# Patient Record
Sex: Male | Born: 1990 | Race: White | Hispanic: No | Marital: Single | Smoking: Current every day smoker
Health system: Southern US, Community
[De-identification: ages and names within clinical notes are randomized; demographics above are authoritative.]

## PROBLEM LIST (undated history)

## (undated) DIAGNOSIS — S42009A Fracture of unspecified part of unspecified clavicle, initial encounter for closed fracture: Secondary | ICD-10-CM

## (undated) DIAGNOSIS — G8929 Other chronic pain: Secondary | ICD-10-CM

## (undated) DIAGNOSIS — F988 Other specified behavioral and emotional disorders with onset usually occurring in childhood and adolescence: Secondary | ICD-10-CM

## (undated) DIAGNOSIS — M549 Dorsalgia, unspecified: Secondary | ICD-10-CM

## (undated) HISTORY — PX: EYE SURGERY: SHX253

---
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
## 2014-10-25 ENCOUNTER — Encounter: Payer: Self-pay | Admitting: Emergency Medicine

## 2014-10-25 ENCOUNTER — Emergency Department
Admission: EM | Admit: 2014-10-25 | Discharge: 2014-10-25 | Disposition: A | Discharge status: Home / Self Care | Payer: Self-pay | Attending: Emergency Medicine | Admitting: Emergency Medicine

## 2014-10-25 ENCOUNTER — Emergency Department: Payer: Self-pay

## 2014-10-25 DIAGNOSIS — X58XXXA Exposure to other specified factors, initial encounter: Secondary | ICD-10-CM | POA: Insufficient documentation

## 2014-10-25 DIAGNOSIS — S8992XA Unspecified injury of left lower leg, initial encounter: Secondary | ICD-10-CM | POA: Insufficient documentation

## 2014-10-25 DIAGNOSIS — Y9389 Activity, other specified: Secondary | ICD-10-CM | POA: Insufficient documentation

## 2014-10-25 DIAGNOSIS — Z720 Tobacco use: Secondary | ICD-10-CM | POA: Insufficient documentation

## 2014-10-25 DIAGNOSIS — Y9289 Other specified places as the place of occurrence of the external cause: Secondary | ICD-10-CM | POA: Insufficient documentation

## 2014-10-25 DIAGNOSIS — Y998 Other external cause status: Secondary | ICD-10-CM | POA: Insufficient documentation

## 2014-10-25 DIAGNOSIS — S39012A Strain of muscle, fascia and tendon of lower back, initial encounter: Secondary | ICD-10-CM | POA: Insufficient documentation

## 2014-10-25 HISTORY — DX: Dorsalgia, unspecified: M54.9

## 2014-10-25 HISTORY — DX: Other chronic pain: G89.29

## 2014-10-25 HISTORY — DX: Fracture of unspecified part of unspecified clavicle, initial encounter for closed fracture: S42.009A

## 2014-10-25 HISTORY — DX: Other specified behavioral and emotional disorders with onset usually occurring in childhood and adolescence: F98.8

## 2014-10-25 MED ORDER — OXYCODONE-ACETAMINOPHEN 5-325 MG PO TABS
ORAL_TABLET | ORAL | Status: AC
  Administered 2014-10-25: 1 via ORAL
  Filled 2014-10-25: qty 1

## 2014-10-25 MED ORDER — IBUPROFEN 800 MG PO TABS
ORAL_TABLET | ORAL | Status: AC
  Administered 2014-10-25: 800 mg via ORAL
  Filled 2014-10-25: qty 1

## 2014-10-25 MED ORDER — OXYCODONE-ACETAMINOPHEN 5-325 MG PO TABS: 1.0000 | ORAL_TABLET | Freq: Four times a day (QID) | ORAL | Status: DC | PRN

## 2014-10-25 MED ORDER — IBUPROFEN 800 MG PO TABS
800.0000 mg | ORAL_TABLET | ORAL | Status: AC
  Administered 2014-10-25: 800 mg via ORAL

## 2014-10-25 MED ORDER — OXYCODONE-ACETAMINOPHEN 5-325 MG PO TABS
1.0000 | ORAL_TABLET | ORAL | Status: AC
  Administered 2014-10-25: 1 via ORAL

## 2014-10-25 MED ORDER — OXYCODONE-ACETAMINOPHEN 5-325 MG PO TABS: 10.0000 | ORAL_TABLET | ORAL | Status: DC
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
## 2014-10-25 NOTE — ED Notes (Signed)
Patient to ER for c/o numbness to BLE. Patient states he picked up tool box from front seat of car that weighed approx 150lbs, immediately had back pain. Patient states gradually developed numbness and pain to legs. States when he got home and laid down, numbness got worse. States numbness lasted approx 1 hour, then had "shock" feeling up legs with "knife stabbing" pain in lower back. Patient continues to have back pain.

## 2014-10-25 NOTE — Discharge Instructions (Signed)
Back Pain, Adult  Please follow up with Dr. Quillian QuinceBliss and Dr. Rosita KeaMenz of orthopedics early this week.  Do not drive today or while taking Percocet. Do not take any other medications which are not prescribed or over-the-counter in conjunction with Percocet.  Low back pain is very common. About 1 in 5 people have back pain.The cause of low back pain is rarely dangerous. The pain often gets better over time.About half of people with a sudden onset of back pain feel better in just 2 weeks. About 8 in 10 people feel better by 6 weeks.  CAUSES Some common causes of back pain include:  Strain of the muscles or ligaments supporting the spine.  Wear and tear (degeneration) of the spinal discs.  Arthritis.  Direct injury to the back. DIAGNOSIS Most of the time, the direct cause of low back pain is not known.However, back pain can be treated effectively even when the exact cause of the pain is unknown.Answering your caregiver's questions about your overall health and symptoms is one of the most accurate ways to make sure the cause of your pain is not dangerous. If your caregiver needs more information, he or she may order lab work or imaging tests (X-rays or MRIs).However, even if imaging tests show changes in your back, this usually does not require surgery. HOME CARE INSTRUCTIONS For many people, back pain returns.Since low back pain is rarely dangerous, it is often a condition that people can learn to [facility]manageon their own.   Remain active. It is stressful on the back to sit or stand in one place. Do not sit, drive, or stand in one place for more than 30 minutes at a time. Take short walks on level surfaces as soon as pain allows.Try to increase the length of time you walk each day.  Do not stay in bed.Resting more than 1 or 2 days can delay your recovery.  Do not avoid exercise or work.Your body is made to move.It is not dangerous to be active, even though your back may hurt.Your back will  likely heal faster if you return to being active before your pain is gone.  Pay attention to your body when you bend and lift. Many people have less discomfortwhen lifting if they bend their knees, keep the load close to their bodies,and avoid twisting. Often, the most comfortable positions are those that put less stress on your recovering back.  Find a comfortable position to sleep. Use a firm mattress and lie on your side with your knees slightly bent. If you lie on your back, put a pillow under your knees.  Only take over-the-counter or prescription medicines as directed by your caregiver. Over-the-counter medicines to reduce pain and inflammation are often the most helpful.Your caregiver may prescribe muscle relaxant drugs.These medicines help dull your pain so you can more quickly return to your normal activities and healthy exercise.  Put ice on the injured area.  Put ice in a plastic bag.  Place a towel between your skin and the bag.  Leave the ice on for 15-20 minutes, 03-04 times a day for the first 2 to 3 days. After that, ice and heat may be alternated to reduce pain and spasms.  Ask your caregiver about trying back exercises and gentle massage. This may be of some benefit.  Avoid feeling anxious or stressed.Stress increases muscle tension and can worsen back pain.It is important to recognize when you are anxious or stressed and learn ways to manage it.Exercise is a great  option. SEEK MEDICAL CARE IF:  You have pain that is not relieved with rest or medicine.  You have pain that does not improve in 1 week.  You have new symptoms.  You are generally not feeling well. SEEK IMMEDIATE MEDICAL CARE IF:   You have pain that radiates from your back into your legs.  You develop new bowel or bladder control problems.  You have unusual weakness or numbness in your arms or legs.  You develop nausea or vomiting.  You develop abdominal pain.  You feel faint. Document  Released: 06/05/2005 Document Revised: 12/05/2011 Document Reviewed: 10/07/2013 [facility] Patient Information 2015 [place], [place]. This information is not intended to replace advice given to you by your health care provider. Make sure you discuss any questions you have with your health care provider.

## 2017-02-23 ENCOUNTER — Encounter: Payer: Self-pay | Admitting: Emergency Medicine

## 2017-02-23 ENCOUNTER — Emergency Department: Payer: Self-pay

## 2017-02-23 ENCOUNTER — Emergency Department
Admission: EM | Admit: 2017-02-23 | Discharge: 2017-02-23 | Disposition: A | Discharge status: Home / Self Care | Payer: Self-pay | Attending: Emergency Medicine | Admitting: Emergency Medicine

## 2017-02-23 DIAGNOSIS — G8929 Other chronic pain: Secondary | ICD-10-CM | POA: Insufficient documentation

## 2017-02-23 DIAGNOSIS — M5412 Radiculopathy, cervical region: Secondary | ICD-10-CM

## 2017-02-23 DIAGNOSIS — M25512 Pain in left shoulder: Secondary | ICD-10-CM

## 2017-02-23 DIAGNOSIS — F17200 Nicotine dependence, unspecified, uncomplicated: Secondary | ICD-10-CM | POA: Insufficient documentation

## 2017-02-23 DIAGNOSIS — M25519 Pain in unspecified shoulder: Secondary | ICD-10-CM

## 2017-02-23 MED ORDER — PREDNISONE 20 MG PO TABS
60.0000 mg | ORAL_TABLET | Freq: Once | ORAL | Status: AC
  Administered 2017-02-23: 60 mg via ORAL
  Filled 2017-02-23: qty 3

## 2017-02-23 MED ORDER — PREDNISONE 10 MG PO TABS
ORAL_TABLET | ORAL | 0 refills | Status: DC
Start: 2017-02-23 — End: 2023-04-09

## 2017-02-23 MED ORDER — KETOROLAC TROMETHAMINE 30 MG/ML IJ SOLN
30.0000 mg | Freq: Once | INTRAMUSCULAR | Status: AC
  Administered 2017-02-23: 30 mg via INTRAMUSCULAR
  Filled 2017-02-23: qty 1
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
## 2017-02-23 NOTE — ED Triage Notes (Signed)
Pt reports recurrent left shoulder pain that has been gradually increasing for three weeks. Pt denies recent injury but states he injured the shoulder 4 years ago. No obvious deformity noted. Pt reports pain increases with movement and lifting his left arm. Pt ambulatory to triage. No apparent distress noted.

## 2017-02-23 NOTE — Discharge Instructions (Signed)
Please take medications as prescribed. Follow-up with orthopedics in one week if no improvement. Return to the ER for any increasing pain worsening symptoms or changes in her health.

## 2018-04-17 IMAGING — CR DG SHOULDER 2+V*L*
1 series · 3 of 3 positions shown · non-contrast
Comparison: Clavicle radiographs 04/13/2011

CLINICAL DATA: Left shoulder pain.

EXAM:
LEFT SHOULDER - 2+ VIEW

[Series 1: dg shoulder left · 0.14mm/px · 3 of 3 slices shown]
[im 1/3]
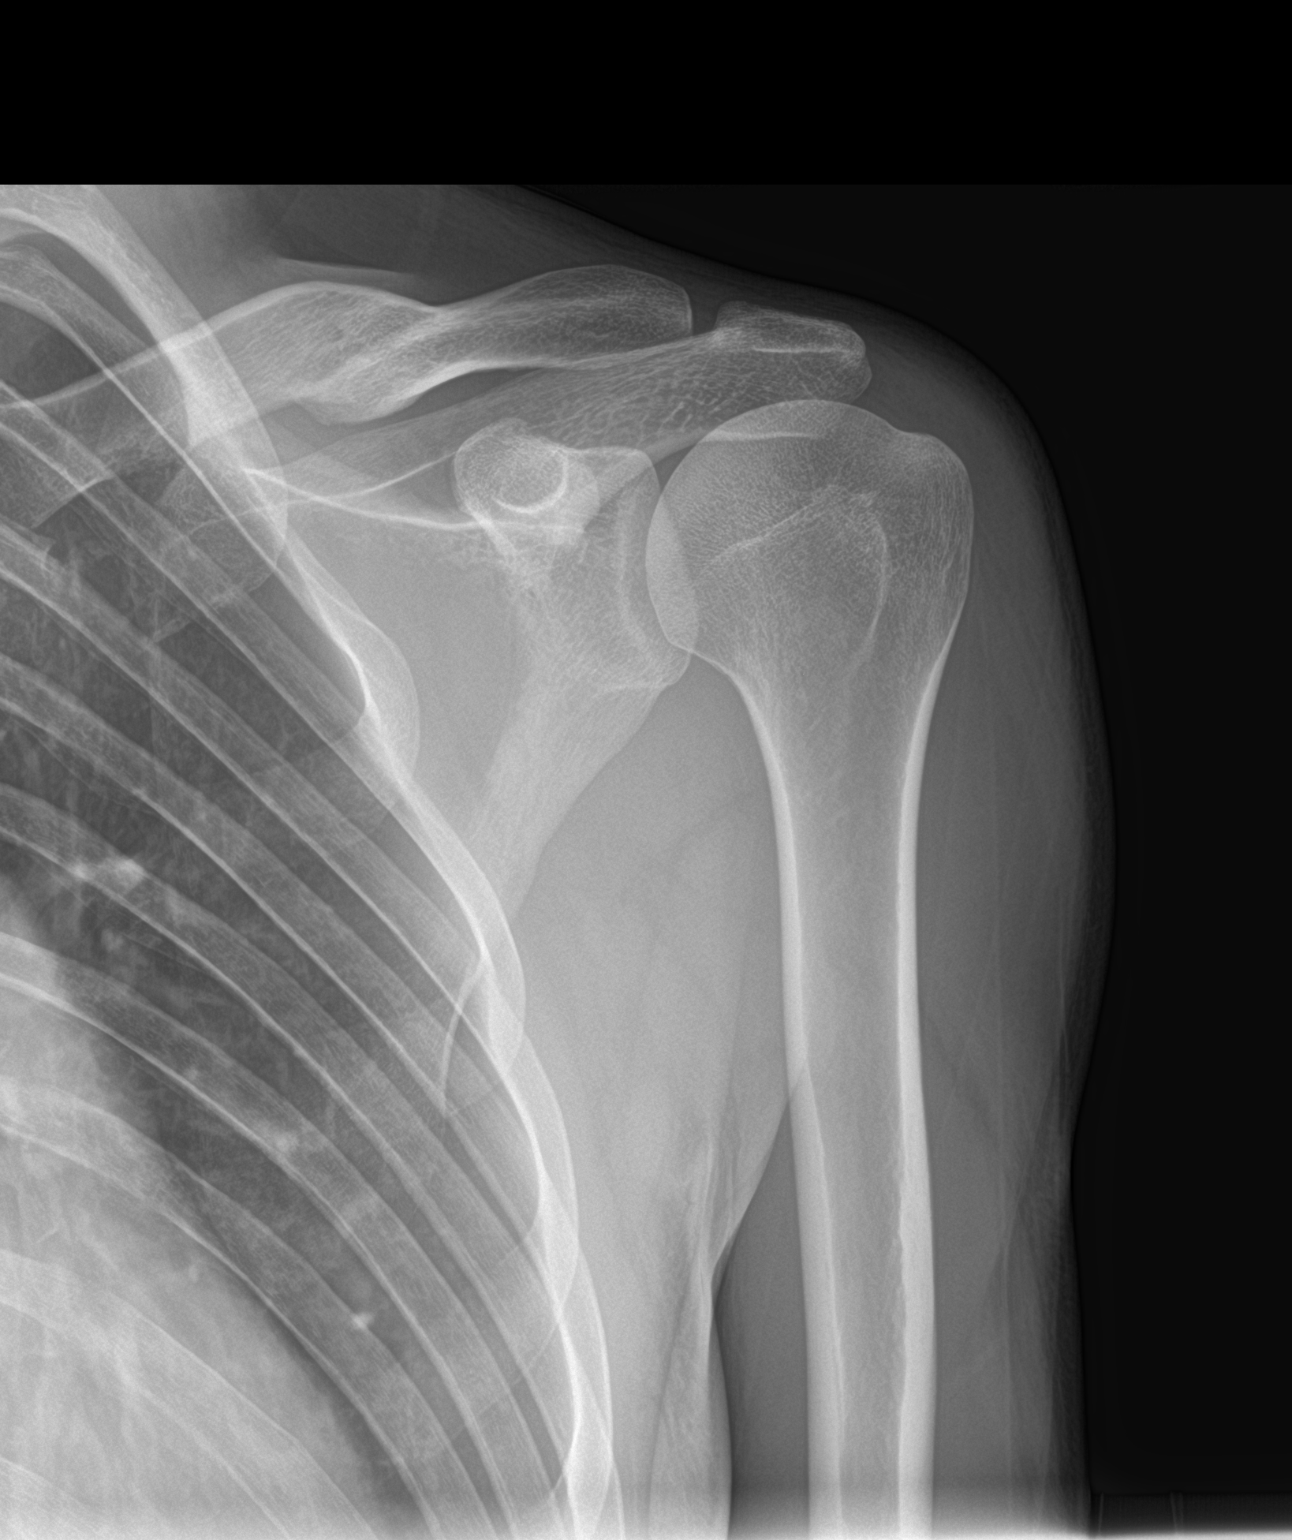
[im 2/3]
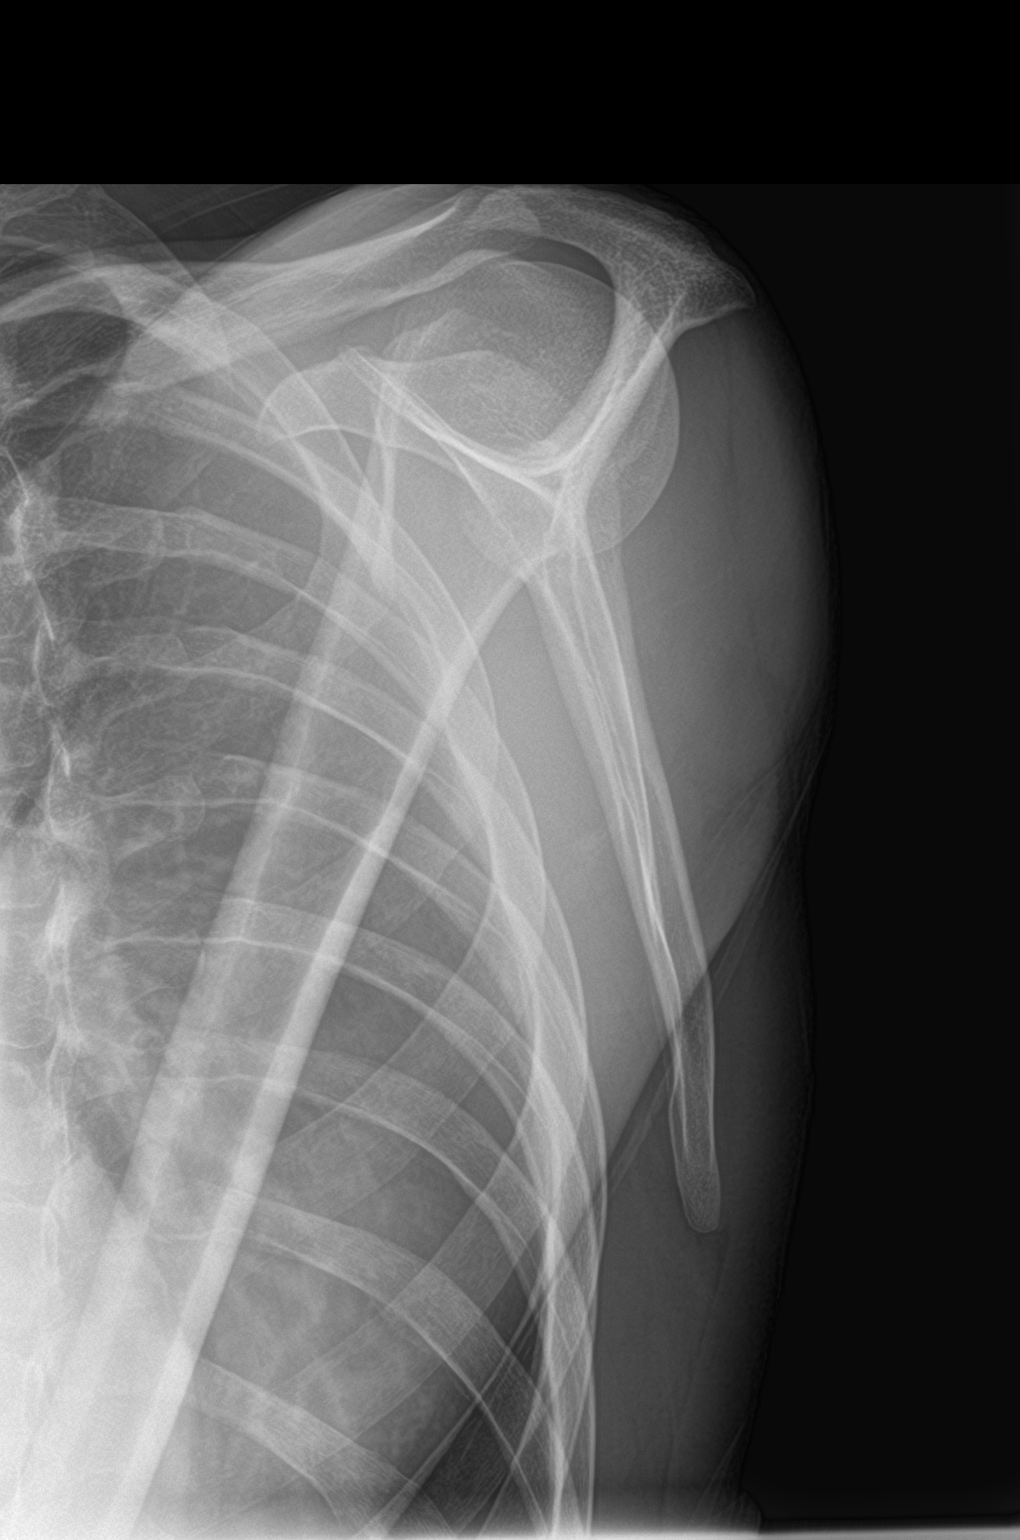
[im 3/3]
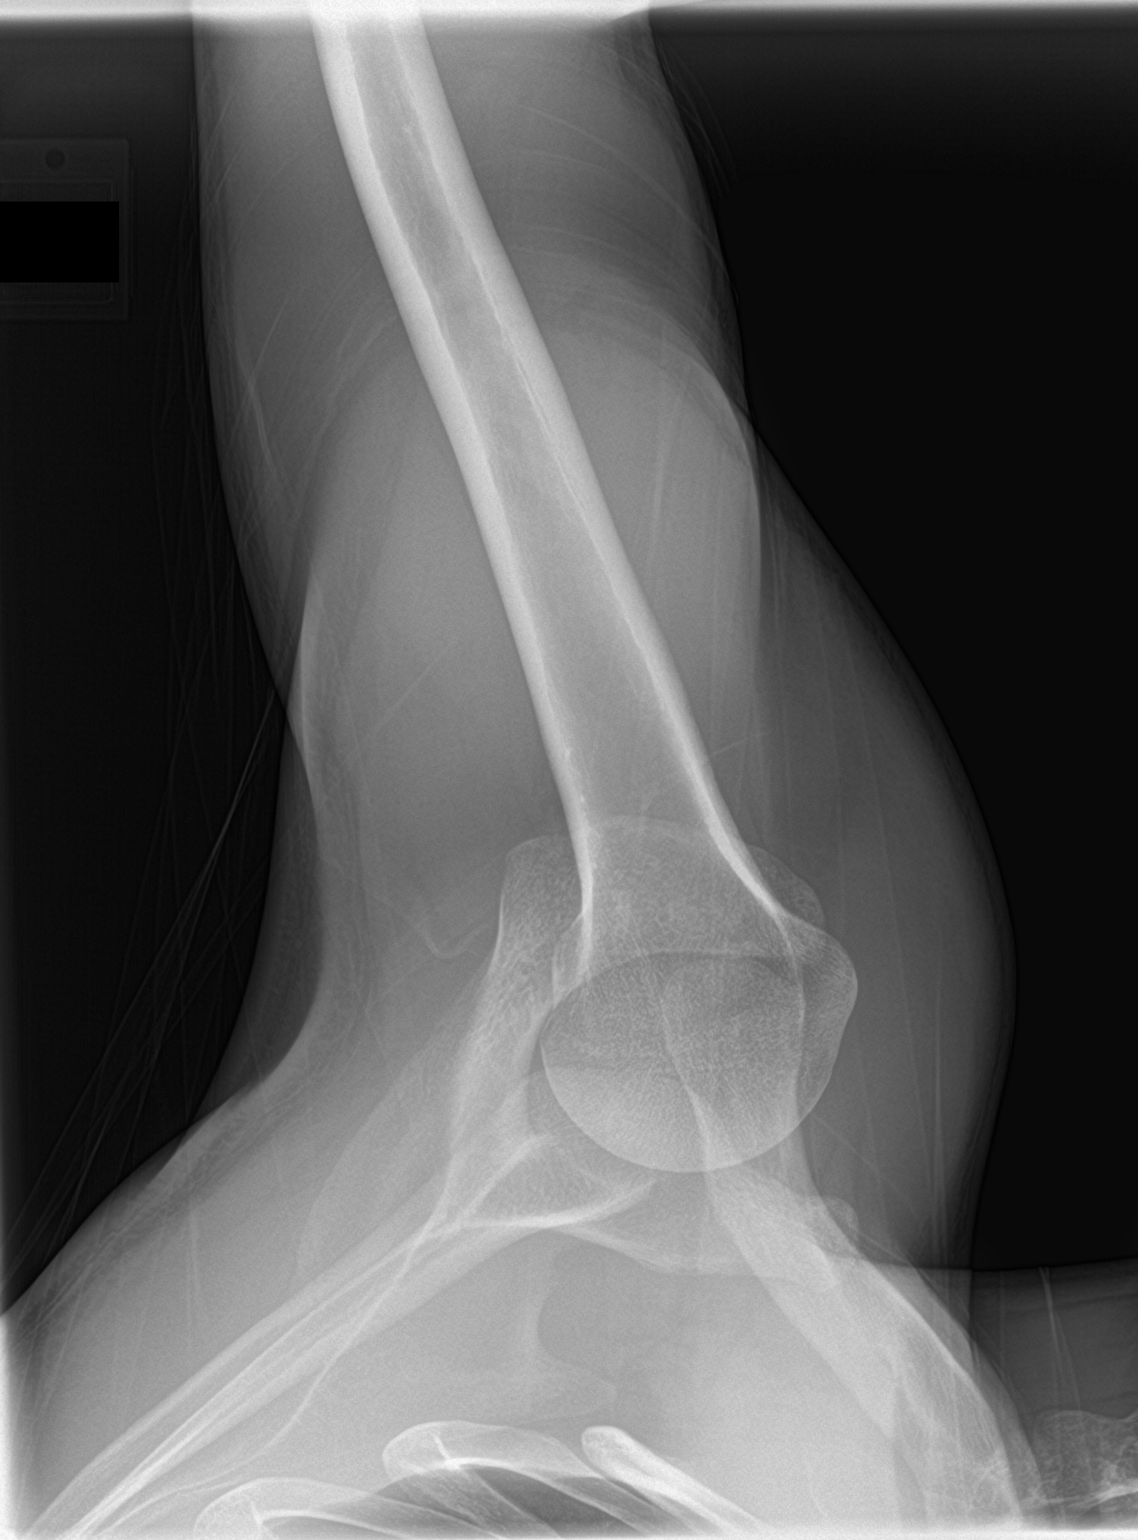

[3 of 3 positions shown; findings below may reference images not displayed]

FINDINGS: Remote midshaft left clavicle fracture with small osseous
remottling, no residual fracture lucency. Normal glenohumeral and
acromioclavicular alignment. No acute fracture. Normal soft tissues.
IMPRESSION: No acute abnormality.  Remote healed left clavicle fracture.

## 2019-09-15 ENCOUNTER — Ambulatory Visit: Payer: Self-pay | Attending: Internal Medicine

## 2019-09-15 DIAGNOSIS — Z23 Encounter for immunization: Secondary | ICD-10-CM

## 2019-09-15 NOTE — Progress Notes (Signed)
   Covid-19 Vaccination Clinic  Name:  ROSS BENDER    MRN: [id number] DOB: May 03, 1991  09/15/2019  Mr. Cephas was observed post Covid-19 immunization for 15 minutes without incident. He was provided with Vaccine Information Sheet and instruction to access the V-Safe system.   Mr. Criado was instructed to call 911 with any severe reactions post vaccine: Marland Kitchen Difficulty breathing  . Swelling of face and throat  . A fast heartbeat  . A bad rash all over body  . Dizziness and weakness   Immunizations Administered    Name Date Dose VIS Date Route   Pfizer COVID-19 Vaccine 09/15/2019  3:33 PM 0.3 mL 05/30/2019 Intramuscular   Manufacturer: [employer], [employer]   Lot: [id number]   NDC: [address]-9

## 2019-10-06 ENCOUNTER — Ambulatory Visit: Payer: Self-pay | Attending: Internal Medicine

## 2019-10-06 DIAGNOSIS — Z23 Encounter for immunization: Secondary | ICD-10-CM

## 2019-10-06 NOTE — Progress Notes (Signed)
   Covid-19 Vaccination Clinic  Name:  EMERALD GEHRES    MRN: [id number] DOB: 16-Mar-1991  10/06/2019  Mr. Corella was observed post Covid-19 immunization for 15 minutes without incident. He was provided with Vaccine Information Sheet and instruction to access the V-Safe system.   Mr. Rosier was instructed to call 911 with any severe reactions post vaccine: Marland Kitchen Difficulty breathing  . Swelling of face and throat  . A fast heartbeat  . A bad rash all over body  . Dizziness and weakness   Immunizations Administered    Name Date Dose VIS Date Route   Pfizer COVID-19 Vaccine 10/06/2019  3:09 PM 0.3 mL 08/13/2018 Intramuscular   Manufacturer: [employer], [employer]   Lot: [id number]   NDC: [address]-5

## 2022-12-29 ENCOUNTER — Other Ambulatory Visit: Payer: Self-pay

## 2022-12-29 ENCOUNTER — Emergency Department
Admission: EM | Admit: 2022-12-29 | Discharge: 2022-12-29 | Disposition: A | Discharge status: Home / Self Care | Payer: Self-pay | Attending: Emergency Medicine | Admitting: Emergency Medicine

## 2022-12-29 ENCOUNTER — Emergency Department: Payer: Self-pay

## 2022-12-29 DIAGNOSIS — Y92410 Unspecified street and highway as the place of occurrence of the external cause: Secondary | ICD-10-CM | POA: Insufficient documentation

## 2022-12-29 DIAGNOSIS — S9031XA Contusion of right foot, initial encounter: Secondary | ICD-10-CM | POA: Insufficient documentation

## 2022-12-29 MED ORDER — KETOROLAC TROMETHAMINE 15 MG/ML IJ SOLN
15.0000 mg | Freq: Once | INTRAMUSCULAR | Status: AC
  Administered 2022-12-29: 15 mg via INTRAMUSCULAR
  Filled 2022-12-29: qty 1

## 2022-12-29 MED ORDER — MELOXICAM 15 MG PO TABS: 15.0000 mg | ORAL_TABLET | Freq: Every day | ORAL | 1 refills | Status: AC

## 2022-12-29 NOTE — ED Provider Notes (Signed)
[facility] Provider Note  Patient Contact: 3:35 PM (approximate)   History   Foot Injury (Right Foot (yesterday))   HPI  Todd Espinoza is a 32 y.o. male presents to the emergency department with acute right foot pain after patient states that a small, 4000 pound tractor ran over his right foot.  Patient reports that injury occurred yesterday and he sustained no abrasions or lacerations.  He has been able to bear weight easily since injury occurred but he states that he does have pain.  He has had no prior surgeries of the right foot in the past and has had no numbness or tingling.      Physical Exam   Triage Vital Signs: ED Triage Vitals [12/29/22 1518]  Encounter Vitals Group     BP (!) 154/99     Systolic BP Percentile      Diastolic BP Percentile      Pulse Rate 99     Resp 16     Temp 97.9 F (36.6 C)     Temp Source Oral     SpO2 98 %     Weight 200 lb (90.7 kg)     Height 6' (1.829 m)     Head Circumference      Peak Flow      Pain Score 5     Pain Loc      Pain Education      Exclude from Growth Chart     Most recent vital signs: Vitals:   12/29/22 1518  BP: (!) 154/99  Pulse: 99  Resp: 16  Temp: 97.9 F (36.6 C)  SpO2: 98%     General: Alert and in no acute distress. Eyes:  PERRL. EOMI. Head: No acute traumatic findings ENT:      Nose: No congestion/rhinnorhea.      Mouth/Throat: Mucous membranes are moist.  Neck: No stridor. No cervical spine tenderness to palpation. Cardiovascular:  Good peripheral perfusion Respiratory: Normal respiratory effort without tachypnea or retractions. Lungs CTAB. Good air entry to the bases with no decreased or absent breath sounds. Gastrointestinal: Bowel sounds 4 quadrants. Soft and nontender to palpation. No guarding or rigidity. No palpable masses. No distention. No CVA tenderness. Musculoskeletal: Full range of motion to all extremities.  No edema or ecchymosis along the posterior  aspect of the right foot.  Palpable dorsalis pedis pulse bilaterally and symmetrically.  Capillary refill less than 2 seconds on the right. Neurologic:  No gross focal neurologic deficits are appreciated.  Skin:   No rash noted Other:   ED Results / Procedures / Treatments   Labs (all labs ordered are listed, but only abnormal results are displayed) Labs Reviewed - No data to display     RADIOLOGY  I personally viewed and evaluated these images as part of my medical decision making, as well as reviewing the written report by the radiologist.  ED Provider Interpretation: X-ray of the the right foot unremarkable.   PROCEDURES:  Critical Care performed: No  Procedures   MEDICATIONS ORDERED IN ED: Medications  ketorolac (TORADOL) 15 MG/ML injection 15 mg (has no administration in time range)     IMPRESSION / MDM / ASSESSMENT AND PLAN / ED COURSE  I reviewed the triage vital signs and the nursing notes.                              Assessment and plan  Foot pain 32 year old male presents to the emergency department with acute right foot pain after patient reportedly had a small tractor ran over his foot yesterday.  Patient was hypertensive at triage but vital signs otherwise reassuring.  On exam, patient was alert, active and nontoxic-appearing.  Foot was nonedematous with no overlying ecchymosis, abrasions or lacerations.  Will obtain x-ray and will reassess.  X-ray unremarkable.  Will place patient in a cam boot and given injection of Toradol for pain.  Patient was discharged with daily meloxicam.     FINAL CLINICAL IMPRESSION(S) / ED DIAGNOSES   Final diagnoses:  Contusion of right foot, initial encounter     Rx / DC Orders   ED Discharge Orders          Ordered    meloxicam (MOBIC) 15 MG tablet  Daily        12/29/22 1624             Note:  This document was prepared using Dragon voice recognition software and may include unintentional dictation  errors.   Pia Mau [place], PA-C 12/29/22 1627    Dionne Bucy, MD 12/29/22 1714

## 2022-12-29 NOTE — Discharge Instructions (Addendum)
Take Meloxicam once daily for pain and inflammation.  

## 2022-12-29 NOTE — ED Notes (Signed)
Back from radiology, EDP at [facility]

## 2022-12-29 NOTE — ED Triage Notes (Signed)
Pt to ed from home via POV for a foot injury that happened yesterday at 3pm./ He states "I tractor ran over my foot". Pt was able to continue working and didn't feel the need to come until today. Pt has foot bandaged and doesn't appear to be swollen and he is able to bear weight on it. Pt I caox4, in no acute distress and wheel chair in triage.

## 2023-04-09 ENCOUNTER — Encounter: Payer: Self-pay | Admitting: Emergency Medicine

## 2023-04-09 ENCOUNTER — Emergency Department: Payer: Self-pay

## 2023-04-09 ENCOUNTER — Other Ambulatory Visit: Payer: Self-pay

## 2023-04-09 ENCOUNTER — Emergency Department
Admission: EM | Admit: 2023-04-09 | Discharge: 2023-04-09 | Disposition: A | Discharge status: Home / Self Care | Payer: Self-pay | Attending: Emergency Medicine | Admitting: Emergency Medicine

## 2023-04-09 DIAGNOSIS — M25512 Pain in left shoulder: Secondary | ICD-10-CM | POA: Insufficient documentation

## 2023-04-09 MED ORDER — NAPROXEN 500 MG PO TABS: 500.0000 mg | ORAL_TABLET | Freq: Two times a day (BID) | ORAL | 0 refills | Status: AC

## 2023-04-09 MED ORDER — KETOROLAC TROMETHAMINE 15 MG/ML IJ SOLN
15.0000 mg | Freq: Once | INTRAMUSCULAR | Status: AC
  Administered 2023-04-09: 15 mg via INTRAMUSCULAR
  Filled 2023-04-09: qty 1

## 2023-04-09 NOTE — ED Provider Triage Note (Signed)
Emergency Medicine Provider Triage Evaluation Note  Todd Espinoza , a 32 y.o. male  was evaluated in triage.  Pt complains of left shoulder pain, chronic but worsening, thinks he might have a tear, difficult to raise arm overhead, etc.  Review of Systems  Positive:  Negative:   Physical Exam  BP (!) 149/103 (BP Location: Left Arm)   Pulse 92   Temp 98.6 F (37 C) (Oral)   Resp 18   Ht 6' (1.829 m)   Wt 85.3 kg   SpO2 100%   BMI 25.50 kg/m  Gen:   Awake, no distress   Resp:  Normal effort  MSK:   Decreased rom of left shoulder  Other:    Medical Decision Making  Medically screening exam initiated at 11:53 AM.  Appropriate orders placed.  Todd Espinoza was informed that the remainder of the evaluation will be completed by another provider, this initial triage assessment does not replace that evaluation, and the importance of remaining in the ED until their evaluation is complete.     Faythe Ghee, PA-C 04/09/23 1154

## 2023-04-09 NOTE — ED Triage Notes (Signed)
Pt ambulatory to emergency room for increasing left shoulder pain for the last week. Contacted PCP who thought it might be a torn rotator cuff.

## 2023-04-09 NOTE — ED Provider Notes (Signed)
[facility] Provider Note    Event Date/Time   First MD Initiated Contact with Patient 04/09/23 1236     (approximate)   History   Shoulder Pain   HPI  Todd Espinoza is a 32 y.o. male left-hand-dominant presents today for evaluation of left shoulder pain.  Patient reports that he had a previous injury multiple years ago, and had been doing well into the past 4 days.  He reports that he has pain with any sort of forward flexion or abduction of his shoulder and he cannot go past 90 degrees.  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  He denies any neck pain or radicular symptoms in his arm.  No previous surgery on his shoulder.  He called his PCP who advised him to come to the emergency department for evaluation.  There are no problems to display for this patient.         Physical Exam   Triage Vital Signs: ED Triage Vitals  Encounter Vitals Group     BP 04/09/23 1144 (!) 149/103     Systolic BP Percentile --      Diastolic BP Percentile --      Pulse Rate 04/09/23 1144 92     Resp 04/09/23 1144 18     Temp 04/09/23 1144 98.6 F (37 C)     Temp Source 04/09/23 1144 Oral     SpO2 04/09/23 1144 100 %     Weight 04/09/23 1145 188 lb (85.3 kg)     Height 04/09/23 1145 6' (1.829 m)     Head Circumference --      Peak Flow --      Pain Score 04/09/23 1152 8     Pain Loc --      Pain Education --      Exclude from Growth Chart --     Most recent vital signs: Vitals:   04/09/23 1144  BP: (!) 149/103  Pulse: 92  Resp: 18  Temp: 98.6 F (37 C)  SpO2: 100%    Physical Exam Vitals and nursing note reviewed.  Constitutional:      General: Awake and alert. No acute distress.    Appearance: Normal appearance. The patient is normal weight.  HENT:     Head: Normocephalic and atraumatic.     Mouth: Mucous membranes are moist.  Eyes:     General: PERRL. Normal  EOMs        Right eye: No discharge.        Left eye: No discharge.     Conjunctiva/sclera: Conjunctivae normal.  Cardiovascular:     Rate and Rhythm: Normal rate and regular rhythm.     Pulses: Normal pulses.     Heart sounds: Normal heart sounds Pulmonary:     Effort: Pulmonary effort is normal. No respiratory distress.     Breath sounds: Normal breath sounds.  Abdominal:     Abdomen is soft. There is no abdominal tenderness. No rebound or guarding. No distention. Musculoskeletal:        General: No swelling. Normal range of motion.     Cervical back: Normal range of motion and neck supple. No midline cervical spine tenderness.  Full range of motion of neck.  Negative Spurling test.  Negative Lhermitte sign.  Normal strength and sensation in bilateral upper extremities. Normal grip strength bilaterally.  Normal  intrinsic muscle function of the hand bilaterally.  Normal radial pulses bilaterally. Left shoulder: No obvious deformity, swelling, ecchymosis, or erythema No clavicular or AC joint tenderness Able to actively and passively forward flex and abduct at shoulder to 90 degrees only, negative drop arm test Pain with Obriens, SLAP, empty can, and lift off tests Pain with internal and external rotation against resistance Pain with Hawkins and Neers Normal ROM at elbow and wrist Normal resisted pronation and supination 2+ radial pulse Normal grip strength Normal intrinsic hand muscle function Skin:    General: Skin is warm and dry.     Capillary Refill: Capillary refill takes less than 2 seconds.     Findings: No rash.  Neurological:     Mental Status: The patient is awake and alert.      ED Results / Procedures / Treatments   Labs (all labs ordered are listed, but only abnormal results are displayed) Labs Reviewed - No data to display   EKG     RADIOLOGY I independently reviewed and interpreted imaging and agree with radiologists  findings.     PROCEDURES:  Critical Care performed:   Procedures   MEDICATIONS ORDERED IN ED: Medications  ketorolac (TORADOL) 15 MG/ML injection 15 mg (15 mg Intramuscular Given 04/09/23 1319)     IMPRESSION / MDM / ASSESSMENT AND PLAN / ED COURSE  I reviewed the triage vital signs and the nursing notes.   Differential diagnosis includes, but is not limited to, rotator cuff injury, bursitis, labral injury, less likely fracture or dislocation.  Patient is awake and alert, hemodynamically stable and afebrile.  There is no cervical spine tenderness, negative Lhermitte and Spurling signs, do not suspect cervical radiculopathy or cervical etiology.  He has normal grip strength, normal intrinsic muscle function of his hands bilaterally, normal radial pulse.  He has significant pain with forward flexion and abduction I cannot go past 90 degrees secondary to pain.  He also has significant pain with O'Brien's and empty can and SLAP test.  I suspect rotator cuff etiology.  X-ray obtained and per my independent interpretation reveals no acute osseous injury.  We discussed symptomatic management and the importance of close outpatient follow-up with orthopedics for further management including possible MRI.  He was encouraged to call today to make an appointment.  He was given the appropriate follow-up information.  We discussed the option of sling for comfort though however I advised him of the risk of adhesive capsulitis and he prefers not to have a sling.  He requested a work note which was provided.  We discussed return precautions and the importance of close outpatient follow-up.  Patient understands and agrees with plan.  He was discharged in stable condition.  Patient's presentation is most consistent with acute complicated illness / injury requiring diagnostic workup.    FINAL CLINICAL IMPRESSION(S) / ED DIAGNOSES   Final diagnoses:  Acute pain of left shoulder     Rx / DC Orders    ED Discharge Orders          Ordered    naproxen (NAPROSYN) 500 MG tablet  2 times daily with meals        04/09/23 1316             Note:  This document was prepared using Dragon voice recognition software and may include unintentional dictation errors.   Jackelyn Hoehn, PA-C 04/09/23 1707    Pilar Jarvis, MD 04/10/23 [phone]

## 2023-04-09 NOTE — Discharge Instructions (Addendum)
Please follow-up with orthopedics for your shoulder pain.  Please return the emergency department for any new, worsening, or change in symptoms or other concerns.  It was a pleasure caring for you today.

## 2023-12-01 ENCOUNTER — Encounter: Payer: Self-pay | Admitting: Emergency Medicine

## 2023-12-01 ENCOUNTER — Emergency Department
Admission: EM | Admit: 2023-12-01 | Discharge: 2023-12-02 | Disposition: A | Discharge status: Other Acute Inpatient Hospital | Payer: Self-pay | Attending: Emergency Medicine | Admitting: Emergency Medicine

## 2023-12-01 ENCOUNTER — Other Ambulatory Visit: Payer: Self-pay

## 2023-12-01 DIAGNOSIS — F4321 Adjustment disorder with depressed mood: Secondary | ICD-10-CM

## 2023-12-01 DIAGNOSIS — Y906 Blood alcohol level of 120-199 mg/100 ml: Secondary | ICD-10-CM | POA: Insufficient documentation

## 2023-12-01 DIAGNOSIS — F1994 Other psychoactive substance use, unspecified with psychoactive substance-induced mood disorder: Secondary | ICD-10-CM

## 2023-12-01 DIAGNOSIS — F10129 Alcohol abuse with intoxication, unspecified: Secondary | ICD-10-CM | POA: Insufficient documentation

## 2023-12-01 DIAGNOSIS — F10920 Alcohol use, unspecified with intoxication, uncomplicated: Secondary | ICD-10-CM

## 2023-12-01 DIAGNOSIS — F1290 Cannabis use, unspecified, uncomplicated: Secondary | ICD-10-CM | POA: Insufficient documentation

## 2023-12-01 DIAGNOSIS — R45851 Suicidal ideations: Secondary | ICD-10-CM | POA: Insufficient documentation

## 2023-12-01 LAB — COMPREHENSIVE METABOLIC PANEL WITH GFR
ALT: 26 U/L (ref 0–44)
AST: 23 U/L (ref 15–41)
Albumin: 4.4 g/dL (ref 3.5–5.0)
Alkaline Phosphatase: 67 U/L (ref 38–126)
Anion gap: 12 (ref 5–15)
BUN: 10 mg/dL (ref 6–20)
CO2: 21 mmol/L — ABNORMAL LOW (ref 22–32)
Calcium: 9.5 mg/dL (ref 8.9–10.3)
Chloride: 109 mmol/L (ref 98–111)
Creatinine, Ser: 0.81 mg/dL (ref 0.61–1.24)
GFR, Estimated: >60 mL/min (ref >60)
Glucose, Bld: 105 mg/dL — ABNORMAL HIGH (ref 70–99)
Potassium: 4 mmol/L (ref 3.5–5.1)
Sodium: 142 mmol/L (ref 135–145)
Total Bilirubin: 0.5 mg/dL (ref 0.0–1.2)
Total Protein: 7.9 g/dL (ref 6.5–8.1)

## 2023-12-01 LAB — CBC
HCT: 43.7 % (ref 39.0–52.0)
Hemoglobin: 14.7 g/dL (ref 13.0–17.0)
MCH: 30.9 pg (ref 26.0–34.0)
MCHC: 33.6 g/dL (ref 30.0–36.0)
MCV: 91.8 fL (ref 80.0–100.0)
Platelets: 235 10*3/uL (ref 150–400)
RBC: 4.76 MIL/uL (ref 4.22–5.81)
RDW: 12 % (ref 11.5–15.5)
WBC: 10.9 10*3/uL — ABNORMAL HIGH (ref 4.0–10.5)
nRBC: 0 % (ref 0.0–0.2)

## 2023-12-01 LAB — URINE DRUG SCREEN, QUALITATIVE (ARMC ONLY)
Amphetamines, Ur Screen: NOT DETECTED
Barbiturates, Ur Screen: NOT DETECTED
Benzodiazepine, Ur Scrn: POSITIVE — AB
Cannabinoid 50 Ng, Ur ~~LOC~~: POSITIVE — AB
Cocaine Metabolite,Ur ~~LOC~~: NOT DETECTED
MDMA (Ecstasy)Ur Screen: NOT DETECTED
Methadone Scn, Ur: NOT DETECTED
Opiate, Ur Screen: NOT DETECTED
Phencyclidine (PCP) Ur S: NOT DETECTED
Tricyclic, Ur Screen: POSITIVE — AB

## 2023-12-01 LAB — ETHANOL: Alcohol, Ethyl (B): 129 mg/dL — ABNORMAL HIGH (ref <15)

## 2023-12-01 MED ORDER — HYDROXYZINE HCL 25 MG PO TABS: 25.0000 mg | ORAL_TABLET | Freq: Three times a day (TID) | ORAL | Status: DC | PRN

## 2023-12-01 MED ORDER — FLUOXETINE HCL 10 MG PO CAPS
10.0000 mg | ORAL_CAPSULE | Freq: Every day | ORAL | Status: DC
  Administered 2023-12-02: 10 mg via ORAL
  Filled 2023-12-01 (×2): qty 1

## 2023-12-01 NOTE — ED Notes (Signed)
TTS talking with patient  

## 2023-12-01 NOTE — ED Notes (Signed)
 Pt Dinner Provided at bedside

## 2023-12-01 NOTE — BH Assessment (Addendum)
 Patient has been accepted to [facility].  Patient assigned to room 402-1 Accepting physician is Dr. Zouev.  Call report to [phone].  Representative was [place], [facility].   ER Staff is aware of it:  Carlene, ER Secretary  Dr. Azalee Bolds, ER MD  Prentice Brochure, Patient's Nurse     Patient can arrive at facility 12/02/23 pending discharges.

## 2023-12-01 NOTE — ED Provider Notes (Signed)
 [facility] Provider Note   Event Date/Time   First MD Initiated Contact with Patient 12/01/23 0249     (approximate) History  Psychiatric Evaluation  HPI Todd Espinoza is a 33 y.o. male with a past medical history of depression with previous suicide attempts to cut his left wrist who presents under IVC from 1 enforcement after making suicidal threats this evening.  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  Patient states that he was going to the woods to clear my head and it was taken out of context.  Patient currently denies any SI, HI, or AVH ROS: Patient currently denies any vision changes, tinnitus, difficulty speaking, facial droop, sore throat, chest pain, shortness of breath, abdominal pain, nausea/vomiting/diarrhea, dysuria, or weakness/numbness/paresthesias in any extremity   Physical Exam  Triage Vital Signs: ED Triage Vitals [12/01/23 0238]  Encounter Vitals Group     BP (!) 141/81     Girls Systolic BP Percentile      Girls Diastolic BP Percentile      Boys Systolic BP Percentile      Boys Diastolic BP Percentile      Pulse Rate (!) 104     Resp 18     Temp 98.2 F (36.8 C)     Temp Source Oral     SpO2 98 %     Weight 184 lb (83.5 kg)     Height 6' (1.829 m)     Head Circumference      Peak Flow      Pain Score      Pain Loc      Pain Education      Exclude from Growth Chart    Most recent vital signs: Vitals:   12/01/23 0238  BP: (!) 141/81  Pulse: (!) 104  Resp: 18  Temp: 98.2 F (36.8 C)  SpO2: 98%   General: Awake, oriented x4. CV:  Good peripheral perfusion. Resp:  Normal effort. Abd:  No distention. Other:  Middle-aged well-developed, well-nourished Caucasian male resting comfortably in no acute distress ED Results / Procedures / Treatments  Labs (all labs ordered are listed, but only abnormal results are displayed) Labs Reviewed  COMPREHENSIVE  METABOLIC PANEL WITH GFR - Abnormal; Notable for the following components:      Result Value   CO2 21 (*)    Glucose, Bld 105 (*)    All other components within normal limits  ETHANOL - Abnormal; Notable for the following components:   Alcohol, Ethyl (B) 129 (*)    All other components within normal limits  CBC - Abnormal; Notable for the following components:   WBC 10.9 (*)    All other components within normal limits  URINE DRUG SCREEN, QUALITATIVE ([facility] ONLY) - Abnormal; Notable for the following components:   Tricyclic, Ur Screen POSITIVE (*)    Cannabinoid 50 Ng, Ur [place] POSITIVE (*)    Benzodiazepine, Ur Scrn POSITIVE (*)    All other components within normal limits  PROCEDURES: Critical Care performed: No Procedures MEDICATIONS ORDERED IN ED: Medications - No data to display IMPRESSION / MDM / ASSESSMENT AND PLAN / ED COURSE  I reviewed the triage vital signs and the nursing notes.                             The patient is on the cardiac monitor to evaluate  for evidence of arrhythmia and/or significant heart rate changes. Patient's presentation is most consistent with acute presentation with potential threat to life or bodily function. Thoughts are linear and organized, and patient has no SI, AH, VH, or HI. Prior suicide attempt by cutting his left wrist Prior Psychiatric Hospitalizations: Multiple  Clinically patient displays no overt toxidrome; they are well appearing, with low suspicion for toxic ingestion given history and exam. Thoughts unlikely 2/2 anemia, hypothyroidism, infection, or ICH.  Consult: Psychiatry to evaluate patient for potential hold for danger to self. Disposition: Care of this patient will be signed out to the oncoming physician at the end of my shift.  All pertinent patient information conveyed and all questions answered.  All further care and disposition decisions will be made by the oncoming physician.   FINAL CLINICAL IMPRESSION(S) / ED DIAGNOSES    Final diagnoses:  Suicidal ideation  Alcoholic intoxication without complication (HCC)   Rx / DC Orders   ED Discharge Orders     None      Note:  This document was prepared using Dragon voice recognition software and may include unintentional dictation errors.   Lakisha Peyser K, MD 12/01/23 [phone]
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
## 2023-12-01 NOTE — ED Notes (Signed)
 Pt Lunch Provided at bedside
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
## 2023-12-01 NOTE — BH Assessment (Signed)
 Comprehensive Clinical Assessment (CCA) Screening, Triage and Referral Note  12/01/2023 Todd Espinoza [id number] Recommendations for Services/Supports/Treatments: Consulted with Todd D., NP, who recommended pt for overnight observation and reassessment in the AM.   Todd Espinoza is a 33 year old, English speaking, Caucasian male. Pt presented to [facility] ED under IVC for a mental health evaluation. Per triage note:   BIB [facility] with IVC paperwork for suicidal threats. When asking pt what brought him to ER pt states I don't know, life I guess     Per patient report, he'd presented to the ED due to his mother, "blowing things out of fucking proportion." Pt identified his main stressors as family conflict and his brother committing suicide 2 weeks ago. Pt minimized the impact of losing his brother and declined grief counseling resources. Pt stated that counseling won't work. Pt reported that he'd unknown amount of ETOH prior to arrival. Pt denied drinking regularly, and does not consider his drinking to be problematic. Pt was in the precontemplation stage of change, explaining that he declines substance abuse treatment in addition to therapy. Pt is prescribed anxiety medication and reports being compliant. Pt had absent insight and impaired judgement. Pt had clear speech and his thoughts were relevant. Pt was guarded and somewhat defensive throughout the assessment. Pt's mood was angry; affect was congruent. Pt had a disheveled appearance. Pt denied SI/HI/AV/H. Pt's BAL was 129 on arrival; UDS +for benzos and cannabis.  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 No data recorded What Is the Reason for Your Visit/Call Today? No data recorded How Long Has This Been Causing You Problems? No data recorded What  Do You Feel Would Help You the Most Today? No data recorded  Have You Recently Had Any Thoughts About Hurting Yourself? No data recorded Are You Planning to Commit Suicide/Harm Yourself At This time? No data recorded  Have you Recently Had Thoughts About Hurting Someone Marigene Shoulder? No data recorded Are You Planning to Harm Someone at This Time? No data recorded Explanation: No data recorded  Have You Used Any Alcohol or Drugs in the Past 24 Hours? No data recorded How Long Ago Did You Use Drugs or Alcohol? No data recorded What Did You Use and How Much? No data recorded  Do You Currently Have a Therapist/Psychiatrist? No data recorded Name of Therapist/Psychiatrist: No data recorded  Have You Been Recently Discharged From Any Office Practice or Programs? No data recorded Explanation of Discharge From Practice/Program: No data recorded   CCA Screening Triage Referral Assessment Type of Contact: No data recorded Telemedicine Service Delivery:   Is this Initial or Reassessment?   Date Telepsych consult ordered in [facility]:    Time Telepsych consult ordered in [facility]:    Location of Assessment: No data recorded Provider Location: No data recorded   Collateral Involvement: No data recorded  Does Patient Have a Court Appointed Legal Guardian? No data recorded Name and Contact of Legal Guardian: No data recorded If Minor and Not Living with Parent(s), Who has Custody? No data recorded Is CPS involved or ever been involved? No data recorded Is APS involved or ever been involved? No data recorded  Patient Determined To Be At Risk for Harm To Self or Others Based on Review of Patient Reported Information or Presenting Complaint? No data recorded Method:  No data recorded Availability of Means: No data recorded Intent: No data recorded Notification Required: No data recorded Additional Information for Danger to Others Potential: No data recorded Additional Comments for Danger to Others Potential: No  data recorded Are There Guns or Other Weapons in Your Home? No data recorded Types of Guns/Weapons: No data recorded Are These Weapons Safely Secured?                            No data recorded Who Could Verify You Are Able To Have These Secured: No data recorded Do You Have any Outstanding Charges, Pending Court Dates, Parole/Probation? No data recorded Contacted To Inform of Risk of Harm To Self or Others: No data recorded  Does Patient Present under Involuntary Commitment? No data recorded   [place] of Residence: No data recorded  Patient Currently Receiving the Following Services: No data recorded  Determination of Need: No data recorded  Options For Referral: No data recorded  Disposition Recommendation per psychiatric provider: Overnight observation and reassessment in the AM.   Todd Espinoza R Todd Espinoza, LCAS

## 2023-12-01 NOTE — ED Notes (Signed)
Breakfast tray provided to pt.

## 2023-12-01 NOTE — ED Triage Notes (Addendum)
 Todd Espinoza with IVC paperwork for suicidal threats  When asking pt what brought him to ER pt states I don't know, life I guess  Denies SI/HI in triage  Pt a&ox4, calm and cooperative, nadn

## 2023-12-01 NOTE — ED Notes (Signed)
 Pt belonging verified by this RN and NT Lisa 1 yellow shirt, 2 slip on shoes, 1 pair green shorts

## 2023-12-02 ENCOUNTER — Encounter (HOSPITAL_COMMUNITY): Payer: Self-pay | Admitting: Psychiatry

## 2023-12-02 ENCOUNTER — Other Ambulatory Visit: Payer: Self-pay

## 2023-12-02 ENCOUNTER — Inpatient Hospital Stay (HOSPITAL_COMMUNITY)
Admission: AD | Admit: 2023-12-02 | Discharge: 2023-12-07 | DRG: 885 | Disposition: A | Discharge status: Home / Self Care | Source: Intra-hospital | Attending: Psychiatry | Admitting: Psychiatry

## 2023-12-02 DIAGNOSIS — Z634 Disappearance and death of family member: Secondary | ICD-10-CM

## 2023-12-02 DIAGNOSIS — F411 Generalized anxiety disorder: Secondary | ICD-10-CM | POA: Diagnosis not present

## 2023-12-02 DIAGNOSIS — F331 Major depressive disorder, recurrent, moderate: Secondary | ICD-10-CM | POA: Diagnosis not present

## 2023-12-02 DIAGNOSIS — F1290 Cannabis use, unspecified, uncomplicated: Secondary | ICD-10-CM | POA: Diagnosis present

## 2023-12-02 DIAGNOSIS — F909 Attention-deficit hyperactivity disorder, unspecified type: Secondary | ICD-10-CM | POA: Diagnosis present

## 2023-12-02 DIAGNOSIS — Z91199 Patient's noncompliance with other medical treatment and regimen due to unspecified reason: Secondary | ICD-10-CM

## 2023-12-02 DIAGNOSIS — Z79899 Other long term (current) drug therapy: Secondary | ICD-10-CM

## 2023-12-02 DIAGNOSIS — F1994 Other psychoactive substance use, unspecified with psychoactive substance-induced mood disorder: Secondary | ICD-10-CM | POA: Diagnosis present

## 2023-12-02 DIAGNOSIS — G4700 Insomnia, unspecified: Secondary | ICD-10-CM | POA: Diagnosis present

## 2023-12-02 DIAGNOSIS — F419 Anxiety disorder, unspecified: Secondary | ICD-10-CM | POA: Diagnosis present

## 2023-12-02 DIAGNOSIS — G8929 Other chronic pain: Secondary | ICD-10-CM | POA: Diagnosis present

## 2023-12-02 DIAGNOSIS — Z9151 Personal history of suicidal behavior: Secondary | ICD-10-CM

## 2023-12-02 DIAGNOSIS — F332 Major depressive disorder, recurrent severe without psychotic features: Secondary | ICD-10-CM | POA: Diagnosis present

## 2023-12-02 DIAGNOSIS — F4321 Adjustment disorder with depressed mood: Secondary | ICD-10-CM | POA: Diagnosis present

## 2023-12-02 DIAGNOSIS — F17210 Nicotine dependence, cigarettes, uncomplicated: Secondary | ICD-10-CM | POA: Diagnosis present

## 2023-12-02 DIAGNOSIS — F329 Major depressive disorder, single episode, unspecified: Secondary | ICD-10-CM | POA: Diagnosis present

## 2023-12-02 MED ORDER — DIPHENHYDRAMINE HCL 50 MG/ML IJ SOLN: 50.0000 mg | Freq: Three times a day (TID) | INTRAMUSCULAR | Status: DC | PRN

## 2023-12-02 MED ORDER — NICOTINE 14 MG/24HR TD PT24
14.0000 mg | MEDICATED_PATCH | Freq: Once | TRANSDERMAL | Status: DC
  Administered 2023-12-02: 14 mg via TRANSDERMAL
  Filled 2023-12-02: qty 1

## 2023-12-02 MED ORDER — FLUOXETINE HCL 10 MG PO CAPS
10.0000 mg | ORAL_CAPSULE | Freq: Every day | ORAL | Status: DC
  Administered 2023-12-03 – 2023-12-04 (×2): 10 mg via ORAL
  Filled 2023-12-02 (×2): qty 1

## 2023-12-02 MED ORDER — HALOPERIDOL LACTATE 5 MG/ML IJ SOLN: 10.0000 mg | Freq: Three times a day (TID) | INTRAMUSCULAR | Status: DC | PRN

## 2023-12-02 MED ORDER — MAGNESIUM HYDROXIDE 400 MG/5ML PO SUSP: 30.0000 mL | Freq: Every day | ORAL | Status: DC | PRN

## 2023-12-02 MED ORDER — LORAZEPAM 2 MG/ML IJ SOLN: 2.0000 mg | Freq: Three times a day (TID) | INTRAMUSCULAR | Status: DC | PRN

## 2023-12-02 MED ORDER — NICOTINE POLACRILEX 2 MG MT GUM
2.0000 mg | CHEWING_GUM | OROMUCOSAL | Status: DC | PRN
  Administered 2023-12-02 – 2023-12-07 (×14): 2 mg via ORAL
  Filled 2023-12-02 (×4): qty 1

## 2023-12-02 MED ORDER — HALOPERIDOL LACTATE 5 MG/ML IJ SOLN: 5.0000 mg | Freq: Three times a day (TID) | INTRAMUSCULAR | Status: DC | PRN

## 2023-12-02 MED ORDER — HYDROXYZINE HCL 25 MG PO TABS
25.0000 mg | ORAL_TABLET | Freq: Three times a day (TID) | ORAL | Status: DC | PRN
  Administered 2023-12-02 – 2023-12-06 (×5): 25 mg via ORAL
  Filled 2023-12-02: qty 10
  Filled 2023-12-02 (×5): qty 1

## 2023-12-02 MED ORDER — ACETAMINOPHEN 325 MG PO TABS: 650.0000 mg | ORAL_TABLET | Freq: Four times a day (QID) | ORAL | Status: DC | PRN

## 2023-12-02 MED ORDER — HALOPERIDOL 5 MG PO TABS: 5.0000 mg | ORAL_TABLET | Freq: Three times a day (TID) | ORAL | Status: DC | PRN

## 2023-12-02 MED ORDER — TRAZODONE HCL 50 MG PO TABS
50.0000 mg | ORAL_TABLET | Freq: Every evening | ORAL | Status: DC | PRN
  Administered 2023-12-02 – 2023-12-06 (×5): 50 mg via ORAL
  Filled 2023-12-02: qty 1
  Filled 2023-12-02: qty 7
  Filled 2023-12-02 (×4): qty 1

## 2023-12-02 MED ORDER — DIPHENHYDRAMINE HCL 25 MG PO CAPS: 50.0000 mg | ORAL_CAPSULE | Freq: Three times a day (TID) | ORAL | Status: DC | PRN

## 2023-12-02 MED ORDER — ALUM & MAG HYDROXIDE-SIMETH 200-200-20 MG/5ML PO SUSP: 30.0000 mL | ORAL | Status: DC | PRN

## 2023-12-02 NOTE — ED Notes (Signed)
 Hospital meal provided, pt tolerated w/o complaints.  Waste discarded appropriately.

## 2023-12-02 NOTE — ED Notes (Signed)
 IVC /patient accepted to [facility] room 402
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
## 2023-12-02 NOTE — Plan of Care (Signed)

## 2023-12-02 NOTE — ED Notes (Signed)
 Patient calm and cooperative upon waking given the phone to update his mother, c/o pain due to arthritis.

## 2023-12-02 NOTE — ED Notes (Signed)
 EMTALA Reviewed by this RN.

## 2023-12-02 NOTE — Progress Notes (Signed)
   12/02/23 2200  Psych Admission Type (Psych Patients Only)  Admission Status Involuntary  Psychosocial Assessment  Patient Complaints Anxiety  Eye Contact Fair  Facial Expression Worried  Affect Appropriate to circumstance  Speech Logical/coherent  Interaction Assertive  Motor Activity Slow  Appearance/Hygiene Unremarkable  Behavior Characteristics Cooperative;Appropriate to situation  Mood Pleasant  Thought Process  Coherency WDL  Content WDL  Delusions None reported or observed  Perception WDL  Hallucination None reported or observed  Judgment Impaired  Confusion WDL  Danger to Self  Current suicidal ideation? Denies  Agreement Not to Harm Self Yes  Description of Agreement verbal  Danger to Others  Danger to Others None reported or observed

## 2023-12-02 NOTE — Plan of Care (Signed)
  Problem: Education: Goal: Knowledge of [place] [facility] Education information/materials will improve Outcome: Progressing   Problem: Safety: Goal: Periods of time without injury will increase Outcome: Progressing   Problem: Physical Regulation: Goal: Ability to maintain clinical measurements within normal limits will improve Outcome: Progressing

## 2023-12-03 ENCOUNTER — Encounter (HOSPITAL_COMMUNITY): Payer: Self-pay | Admitting: Psychiatric/Mental Health

## 2023-12-03 ENCOUNTER — Encounter (HOSPITAL_COMMUNITY): Payer: Self-pay

## 2023-12-03 DIAGNOSIS — F332 Major depressive disorder, recurrent severe without psychotic features: Secondary | ICD-10-CM | POA: Diagnosis not present

## 2023-12-03 MED ORDER — BUSPIRONE HCL 10 MG PO TABS
20.0000 mg | ORAL_TABLET | Freq: Two times a day (BID) | ORAL | Status: DC
  Administered 2023-12-03 – 2023-12-07 (×8): 20 mg via ORAL
  Filled 2023-12-03 (×5): qty 2
  Filled 2023-12-03: qty 28
  Filled 2023-12-03: qty 2
  Filled 2023-12-03: qty 28
  Filled 2023-12-03 (×2): qty 2

## 2023-12-03 NOTE — Plan of Care (Signed)
   Problem: Education: Goal: Emotional status will improve Outcome: Progressing Goal: Mental status will improve Outcome: Progressing   Problem: Safety: Goal: Periods of time without injury will increase Outcome: Progressing

## 2023-12-03 NOTE — Group Note (Signed)
 Date:  12/03/2023 Time:  1:13 AM  Group Topic/Focus:  Wrap-Up Group:   The focus of this group is to help patients review their daily goal of treatment and discuss progress on daily workbooks.    Participation Level:  Active  Participation Quality:  Appropriate  Affect:  Appropriate  Cognitive:  Appropriate  Insight: Improving  Engagement in Group:  Engaged  Modes of Intervention:  Discussion  Additional Comments:  Pt attended the evening wrap-up group. Tech introduced the staff for the evening, reminded group of the evening schedule and reminded them to ask for anything they need. Pt read and discussed a poem named Autobiography in [employer]. Pt shared his perspective on the poem and its connection to substance use.   Leanor Proper Marquie Aderhold 12/03/2023, 1:13 AM

## 2023-12-03 NOTE — Group Note (Signed)
 Occupational Therapy Group Note  Group Topic: Sleep Hygiene  Group Date: 12/03/2023 Start Time: 1430 End Time: 1500 Facilitators: Lynnda Sas, OT   Group Description: Group encouraged increased participation and engagement through topic focused on sleep hygiene. Patients reflected on the quality of sleep they typically receive and identified areas that need improvement. Group was given background information on sleep and sleep hygiene, including common sleep disorders. Group members also received information on how to improve one's sleep and introduced a sleep diary as a tool that can be utilized to track sleep quality over a length of time. Group session ended with patients identifying one or more strategies they could utilize or implement into their sleep routine in order to improve overall sleep quality.        Therapeutic Goal(s):  Identify one or more strategies to improve overall sleep hygiene  Identify one or more areas of sleep that are negatively impacted (sleep too much, too little, etc)     Participation Level: Engaged   Participation Quality: Independent   Behavior: Appropriate   Speech/Thought Process: Relevant   Affect/Mood: Appropriate   Insight: Fair   Judgement: Fair      Modes of Intervention: Education  Patient Response to Interventions:  Attentive   Plan: Continue to engage patient in OT groups 2 - 3x/week.  12/03/2023  Lynnda Sas, OT   Mykel Sponaugle, OT

## 2023-12-03 NOTE — Progress Notes (Signed)
   12/03/23 1600  Psych Admission Type (Psych Patients Only)  Admission Status Involuntary  Psychosocial Assessment  Patient Complaints None  Eye Contact Fair  Facial Expression Animated  Affect Other (Comment) (Bright; friendly)  Speech Logical/coherent  Interaction Assertive  Motor Activity Other (Comment) (Unremarkable)  Appearance/Hygiene Unremarkable  Behavior Characteristics Cooperative  Mood Pleasant (Pt is bright with full range affect. He is social in the milieu, laughing and joking with peers/staff.  Pt does not appear anxious or depressed and denied SI/HI/AVH.)  Thought Process  Coherency WDL  Content WDL  Delusions None reported or observed  Perception WDL  Hallucination None reported or observed  Judgment Impaired  Confusion None  Danger to Self  Current suicidal ideation? Denies  Agreement Not to Harm Self Yes  Description of Agreement Verbal  Danger to Others  Danger to Others None reported or observed

## 2023-12-03 NOTE — Progress Notes (Signed)
   12/03/23 2200  Psych Admission Type (Psych Patients Only)  Admission Status Involuntary  Psychosocial Assessment  Patient Complaints None  Eye Contact Fair  Facial Expression Animated  Affect Appropriate to circumstance  Speech Logical/coherent  Interaction Assertive  Motor Activity Slow  Appearance/Hygiene Unremarkable  Behavior Characteristics Cooperative  Mood Pleasant  Aggressive Behavior  Effect No apparent injury  Thought Process  Coherency WDL  Content WDL  Delusions WDL  Perception WDL  Hallucination None reported or observed  Judgment WDL  Confusion WDL  Danger to Self  Current suicidal ideation? Denies  Danger to Others  Danger to Others None reported or observed

## 2023-12-03 NOTE — Plan of Care (Signed)
  Problem: Education: Goal: Knowledge of [place] [facility] Education information/materials will improve Outcome: Progressing   Problem: Coping: Goal: Ability to verbalize frustrations and anger appropriately will improve Outcome: Progressing Goal: Ability to demonstrate self-control will improve Outcome: Progressing   Problem: Safety: Goal: Periods of time without injury will increase Outcome: Progressing   

## 2023-12-03 NOTE — [facility] Suicide Risk Assessment (Signed)
 [facility] INPATIENT:  Family/Significant Other Suicide Prevention Education  Suicide Prevention Education:  Education Completed; Arav Bannister (mother) [phone],  (name of family member/significant other) has been identified by the patient as the family member/significant other with whom the patient will be residing, and identified as the person(s) who will aid the patient in the event of a mental health crisis (suicidal ideations/suicide attempt).  With written consent from the patient, the family member/significant other has been provided the following suicide prevention education, prior to the and/or following the discharge of the patient.  Pt attempted suicide 2 years ago with a razor blade per mom. Reports he self medicates with alcohol. He is able to return home with parents at discharge. There are firearms in the home but they will be locked up prior to discharge. Pt will not have access when he returns home.  The suicide prevention education provided includes the following: Suicide risk factors Suicide prevention and interventions National Suicide Hotline telephone number [facility] assessment telephone number [facility] Emergency Assistance 911 [employer] and/or Residential Mobile Crisis Unit telephone number  Request made of family/significant other to: Remove weapons (e.g., guns, rifles, knives), all items previously/currently identified as safety concern.   Remove drugs/medications (over-the-counter, prescriptions, illicit drugs), all items previously/currently identified as a safety concern.  The family member/significant other verbalizes understanding of the suicide prevention education information provided.  The family member/significant other agrees to remove the items of safety concern listed above.  Vonzell Guerin 12/03/2023, 3:28 PM

## 2023-12-03 NOTE — [facility] Suicide Risk Assessment (Signed)
 Suicide Risk Assessment  Admission Assessment    [place] Admission Suicide Risk Assessment   Nursing information obtained from:  Patient Demographic factors:  Male Current Mental Status:  NA Loss Factors:  Loss of significant relationship Historical Factors:  Prior suicide attempts Risk Reduction Factors:  Sense of responsibility to family, Living with another person, especially a relative  Total Time spent with patient: 45 minutes Principal Problem: MDD (major depressive disorder) Diagnosis:  Principal Problem:   MDD (major depressive disorder)  Adjustment disorder with depressed mood Substance-induced mood disorder Marijuana use ADHD  Subjective Data:  Todd Espinoza is a 33 year old Caucasian male with prior psychiatric history significant for MDD, recurrent, severe without psychotic features, adjustment disorder with depressed mood, ADHD, substance-induced mood disorder, and marijuana use.  Patient presents involuntarily to [facility] ED for worsening depression resulting in suicidal attempt in the context of his brother committing suicide 2 weeks ago.  BAL 129, UDS positive for benzos, THC, tricyclic  Continued Clinical Symptoms:  Alcohol Use Disorder Identification Test Final Score (AUDIT): 10 The Alcohol Use Disorders Identification Test, Guidelines for Use in Primary Care, Second Edition.  World [employer] writer [facility]). Score between 0-7:  no or low risk or alcohol related problems. Score between 8-15:  moderate risk of alcohol related problems. Score between 16-19:  high risk of alcohol related problems. Score 20 or above:  warrants further diagnostic evaluation for alcohol dependence and treatment.  CLINICAL FACTORS:   Severe Anxiety and/or Agitation Depression:   Impulsivity Severe Alcohol/Substance Abuse/Dependencies More than one psychiatric diagnosis Previous Psychiatric Diagnoses and  Treatments  Musculoskeletal: Strength & Muscle Tone: within normal limits Gait & Station: normal Patient leans: N/A  Psychiatric Specialty Exam:  Presentation  General Appearance: Appropriate for Environment; Casual; Fairly Groomed  Eye Contact:Good  Speech:Clear and Coherent  Speech Volume:Normal  Handedness:Right  Mood and Affect  Mood:Anxious; Depressed (Patient minimizing anxiety and depression symptoms.  Rates both as numbers 0/10 with 10 being high severity)  Affect:Non-Congruent  Thought Process  Thought Processes:Coherent  Descriptions of Associations:Intact  Orientation:Full (Time, Place and Person)  Thought Content:Logical  History of Schizophrenia/Schizoaffective disorder:No data recorded Duration of Psychotic Symptoms:No data recorded Hallucinations:Hallucinations: None  Ideas of Reference:None  Suicidal Thoughts:Suicidal Thoughts: No  Homicidal Thoughts:Homicidal Thoughts: No  Sensorium  Memory:Immediate Good; Recent Good  Judgment:Fair  Insight:Fair  Executive Functions  Concentration:Fair  Attention Span:Fair  Recall:Good  Fund of Knowledge:Good  Language:Good  Psychomotor Activity  Psychomotor Activity:Psychomotor Activity: Normal  Assets  Assets:Communication Skills; Physical Health; Resilience  Sleep  Sleep:Sleep: Good Number of Hours of Sleep: 7.25  Physical Exam: Physical Exam Constitutional:      Appearance: He is normal weight.  HENT:     Head: Normocephalic.     Right Ear: External ear normal.     Left Ear: External ear normal.     Nose: Nose normal.     Mouth/Throat:     Mouth: Mucous membranes are moist.     Pharynx: Oropharynx is clear.   Eyes:     Extraocular Movements: Extraocular movements intact.    Cardiovascular:     Rate and Rhythm: Normal rate.     Pulses: Normal pulses.  Pulmonary:     Effort: Pulmonary effort is normal.  Abdominal:     Comments: Deferred  Genitourinary:    Comments:  Deferred  Musculoskeletal:        General: Normal range of motion.     Cervical  back: Normal range of motion.   Skin:    General: Skin is warm.   Neurological:     General: No focal deficit present.     Mental Status: He is alert and oriented to person, place, and time.   Psychiatric:        Mood and Affect: Mood normal.        Behavior: Behavior normal.        Thought Content: Thought content normal.    Review of Systems  Constitutional:  Negative for chills and fever.  HENT:  Negative for sore throat.   Eyes:  Negative for blurred vision.  Respiratory:  Negative for cough, sputum production, shortness of breath and wheezing.   Cardiovascular:  Negative for chest pain and palpitations.  Gastrointestinal:  Negative for abdominal pain, constipation, diarrhea, heartburn, nausea and vomiting.  Genitourinary:  Negative for dysuria, frequency and urgency.  Musculoskeletal:  Negative for falls.  Skin:  Negative for itching and rash.  Neurological:  Negative for dizziness, tingling and headaches.  Endo/Heme/Allergies:        See allergy listing  Psychiatric/Behavioral:  Positive for depression. Negative for hallucinations, substance abuse and suicidal ideas. The patient is nervous/anxious. The patient does not have insomnia.    Blood pressure 121/76, pulse 80, temperature 98.9 F (37.2 C), temperature source Oral, resp. rate 18, height 6' (1.829 m), weight 78.9 kg, SpO2 99%. Body mass index is 23.6 kg/m.  COGNITIVE FEATURES THAT CONTRIBUTE TO RISK:  Polarized thinking    SUICIDE RISK:   Severe:  Frequent, intense, and enduring suicidal ideation, specific plan, no subjective intent, but some objective markers of intent (i.e., choice of lethal method), the method is accessible, some limited preparatory behavior, evidence of impaired self-control, severe dysphoria/symptomatology, multiple risk factors present, and few if any protective factors, particularly a lack of social  support.  PLAN OF CARE: Treatment Plan Summary: Daily contact with patient to assess and evaluate symptoms and progress in treatment and Medication management  Physician Treatment Plan for Primary Diagnosis:  Assessment:  Todd Espinoza is a 33 year old Caucasian male with prior psychiatric history significant for MDD, recurrent, severe without psychotic features, adjustment disorder with depressed mood, ADHD, substance-induced mood disorder, and marijuana use.  Patient presents involuntarily to [facility] from [facility] ED for worsening depression resulting in suicidal attempt in the context of his brother committing suicide 2 weeks ago.  BAL 129, UDS positive for benzos, THC, tricyclic  MDD (major depressive disorder)  Plans: Medications: --Continue Prozac 10 mg p.o. daily for depression and anxiety -- Continue trazodone 50 mg tablet p.o. q. nightly as needed for insomnia  Medication for other medical problems: --Continue nicotine gum 2 mg p.o. as needed for smoking cessation  Other PRN Medications  -Acetaminophen  650 mg every 6 as needed/mild pain  -Maalox 30 mL oral every 4 as needed/digestion  -Magnesium hydroxide 30 mL daily as needed/mild constipation   --The risks/benefits/side-effects/alternatives to this medication were discussed in detail with the patient and time was given for questions. The patient consents to medication trial.   -- Metabolic profile and EKG monitoring obtained while on an atypical antipsychotic (BMI: Lipid Panel: HbgA1c: QTc:)   -- Encouraged patient to participate in unit milieu and in scheduled group therapies   Continue BH Agitation Protocol  --Haldol 5 mg, oral, 3 times daily as needed, mild agitation  --Benadryl 50 mg, oral, 3 times daily as needed, mild agitation  OR   --Haldol injection 5 mg, IM, 3 times daily as needed, moderate agitation  --Benadryl injection 50 mg,  IM, 3 times daily as needed, moderate agitation  --Ativan injection 2 mg, IM, 3 times daily as needed, moderate agitation                                      OR  --Haldol injection 10 mg, IM, 3 times daily as needed, severe agitation  --Benadryl injection 50 mg, IM, 3 times daily as needed, severe agitation  --Ativan injection 2 mg, IM, 3 times daily as needed, severe agitation   Admission labs reviewed: CMP: CO2 21, glucose 105, otherwise normal.  CBC with differential WBC 10.9, otherwise normal.  UDS: Positive for benzos, positive for marijuana positive for tricyclic.  BAL 129.Aaron Aas  New labs ordered: None at this time  EKG reviewed: Normal sinus rhythm, ventricular rate 68, QT /QTc: 404/429.  Safety and Monitoring:  Voluntary admission to inpatient psychiatric unit for safety, stabilization and treatment  Daily contact with patient to assess and evaluate symptoms and progress in treatment  Patient's case to be discussed in multi-disciplinary team meeting  Observation Level : q15 minute checks  Vital signs: q12 hours  Precautions: suicide, but pt currently verbally contracts for safety on unit?   Discharge Planning:  Social work and case management to assist with discharge planning and identification of hospital follow-up needs prior to discharge  Estimated LOS: 5-7?days  Discharge Concerns: Need to establish a safety plan; Medication compliance and effectiveness  Discharge Goals: Return home with outpatient referrals for mental health follow-up in  Long Term Goal(s): Improvement in symptoms so as ready for discharge  Short Term Goals: Ability to identify changes in lifestyle to reduce recurrence of condition will improve, Ability to verbalize feelings will improve, Ability to disclose and discuss suicidal ideas, Ability to demonstrate self-control will improve, Ability to identify and develop effective coping behaviors will improve, Ability to maintain clinical measurements within normal  limits will improve, Compliance with prescribed medications will improve, and Ability to identify triggers associated with substance abuse/mental health issues will improve  Physician Treatment Plan for Secondary Diagnosis: Principal Problem:   MDD (major depressive disorder)   I certify that inpatient services furnished can reasonably be expected to improve the patient's condition.   Laurence Pons, FNP 12/03/2023, 6:41 PM

## 2023-12-03 NOTE — Group Note (Signed)
 Date:  12/03/2023 Time:  10:21 AM  Group Topic/Focus:  Emotional Education:   The focus of this group is to discuss what feelings/emotions are, and how they are experienced. Goals Group:   The focus of this group is to help patients establish daily goals to achieve during treatment and discuss how the patient can incorporate goal setting into their daily lives to aide in recovery. Orientation:   The focus of this group is to educate the patient on the purpose and policies of crisis stabilization and provide a format to answer questions about their admission.  The group details unit policies and expectations of patients while admitted.    Participation Level:  Did Not Attend  Todd Espinoza 12/03/2023, 10:21 AM

## 2023-12-03 NOTE — Group Note (Signed)
 Recreation Therapy Group Note   Group Topic:Stress Management  Group Date: 12/03/2023 Start Time: 9604 End Time: 1003 Facilitators: Hagop Mccollam-McCall, LRT,CTRS Location: [address] Dayroom   Group Topic: Stress Management   Goal Area(s) Addresses:  Patient will actively participate in stress management techniques presented during session.  Patient will successfully identify benefit of practicing stress management post d/c.   Behavioral Response: Appropriate  Intervention: Relaxation exercise with ambient sound and script   Activity: Guided Imagery. LRT provided education, instruction, and demonstration on practice of visualization via guided imagery. Patient was asked to participate in the technique introduced during session. LRT debriefed including topics of mindfulness, stress management and specific scenarios each patient could use these techniques. Patients were given suggestions of ways to access scripts post d/c and encouraged to explore Youtube and other apps available on smartphones, tablets, and computers.  Education:  Stress Management, Discharge Planning.   Education Outcome: Acknowledges education   Affect/Mood: Appropriate   Participation Level: Engaged   Participation Quality: Independent   Behavior: Appropriate   Speech/Thought Process: Focused   Insight: Good   Judgement: Good   Modes of Intervention: Guided Imagery   Patient Response to Interventions:  Engaged   Education Outcome:  In group clarification offered    Clinical Observations/Individualized Feedback: Patient actively engaged in technique introduced, expressed no concerns and demonstrated ability to practice skill independently post d/c.    Plan: Continue to engage patient in RT group sessions 2-3x/week.   Mandrell Vangilder-McCall, LRT,CTRS 12/03/2023 1:11 PM

## 2023-12-03 NOTE — BH IP Treatment Plan (Signed)
 Interdisciplinary Treatment and Diagnostic Plan Update  12/03/2023 Time of Session: 3:28PM Todd Espinoza MRN: [id number]  Principal Diagnosis: MDD (major depressive disorder)  Secondary Diagnoses: Principal Problem:   MDD (major depressive disorder)   Current Medications:  Current Facility-Administered Medications  Medication Dose Route Frequency Provider Last Rate Last Admin   acetaminophen  (TYLENOL ) tablet 650 mg  650 mg Oral Q6H PRN Al Alias, NP       alum & mag hydroxide-simeth (MAALOX/MYLANTA) 200-200-20 MG/5ML suspension 30 mL  30 mL Oral Q4H PRN Dixon, Rashaun M, NP       haloperidol (HALDOL) tablet 5 mg  5 mg Oral TID PRN Al Alias, NP       And   diphenhydrAMINE (BENADRYL) capsule 50 mg  50 mg Oral TID PRN Al Alias, NP       haloperidol lactate (HALDOL) injection 5 mg  5 mg Intramuscular TID PRN Al Alias, NP       And   diphenhydrAMINE (BENADRYL) injection 50 mg  50 mg Intramuscular TID PRN Al Alias, NP       And   LORazepam (ATIVAN) injection 2 mg  2 mg Intramuscular TID PRN Al Alias, NP       haloperidol lactate (HALDOL) injection 10 mg  10 mg Intramuscular TID PRN Al Alias, NP       And   diphenhydrAMINE (BENADRYL) injection 50 mg  50 mg Intramuscular TID PRN Al Alias, NP       And   LORazepam (ATIVAN) injection 2 mg  2 mg Intramuscular TID PRN Al Alias, NP       FLUoxetine (PROZAC) capsule 10 mg  10 mg Oral Daily Dixon, Rashaun M, NP   10 mg at 12/03/23 3875   hydrOXYzine (ATARAX) tablet 25 mg  25 mg Oral TID PRN Al Alias, NP   25 mg at 12/02/23 2120   magnesium hydroxide (MILK OF MAGNESIA) suspension 30 mL  30 mL Oral Daily PRN Al Alias, NP       nicotine polacrilex (NICORETTE) gum 2 mg  2 mg Oral PRN Zouev, Dmitri, MD   2 mg at 12/03/23 1245   traZODone (DESYREL) tablet 50 mg  50 mg Oral QHS PRN Dixon, Rashaun M, NP   50 mg at 12/02/23 2120   PTA Medications: Medications Prior  to Admission  Medication Sig Dispense Refill Last Dose/Taking   ibuprofen  (ADVIL ,MOTRIN ) 200 MG tablet Take 800 mg by mouth every 6 (six) hours as needed for moderate pain. (Patient not taking: Reported on 12/01/2023)       Patient Stressors:    Patient Strengths:    Treatment Modalities: Medication Management, Group therapy, Case management,  1 to 1 session with clinician, Psychoeducation, Recreational therapy.   Physician Treatment Plan for Primary Diagnosis: MDD (major depressive disorder) Long Term Goal(s):     Short Term Goals:    Medication Management: Evaluate patient's response, side effects, and tolerance of medication regimen.  Therapeutic Interventions: 1 to 1 sessions, Unit Group sessions and Medication administration.  Evaluation of Outcomes: Not Progressing  Physician Treatment Plan for Secondary Diagnosis: Principal Problem:   MDD (major depressive disorder)  Long Term Goal(s):     Short Term Goals:       Medication Management: Evaluate patient's response, side effects, and tolerance of medication regimen.  Therapeutic Interventions: 1 to 1 sessions, Unit Group sessions and Medication administration.  Evaluation of Outcomes: Not Progressing  RN Treatment Plan for Primary Diagnosis: MDD (major depressive disorder) Long Term Goal(s): Knowledge of disease and therapeutic regimen to maintain health will improve  Short Term Goals: Ability to remain free from injury will improve, Ability to verbalize frustration and anger appropriately will improve, Ability to demonstrate self-control, Ability to participate in decision making will improve, Ability to verbalize feelings will improve, Ability to disclose and discuss suicidal ideas, Ability to identify and develop effective coping behaviors will improve, and Compliance with prescribed medications will improve  Medication Management: RN will administer medications as ordered by provider, will assess and evaluate  patient's response and provide education to patient for prescribed medication. RN will report any adverse and/or side effects to prescribing provider.  Therapeutic Interventions: 1 on 1 counseling sessions, Psychoeducation, Medication administration, Evaluate responses to treatment, Monitor vital signs and CBGs as ordered, Perform/monitor CIWA, COWS, AIMS and Fall Risk screenings as ordered, Perform wound care treatments as ordered.  Evaluation of Outcomes: Not Progressing   LCSW Treatment Plan for Primary Diagnosis: MDD (major depressive disorder) Long Term Goal(s): Safe transition to appropriate next level of care at discharge, Engage patient in therapeutic group addressing interpersonal concerns.  Short Term Goals: Engage patient in aftercare planning with referrals and resources, Increase social support, Increase ability to appropriately verbalize feelings, Increase emotional regulation, Facilitate acceptance of mental health diagnosis and concerns, Facilitate patient progression through stages of change regarding substance use diagnoses and concerns, Identify triggers associated with mental health/substance abuse issues, and Increase skills for wellness and recovery  Therapeutic Interventions: Assess for all discharge needs, 1 to 1 time with Social worker, Explore available resources and support systems, Assess for adequacy in community support network, Educate family and significant other(s) on suicide prevention, Complete Psychosocial Assessment, Interpersonal group therapy.  Evaluation of Outcomes: Not Progressing   Progress in Treatment: Attending groups: Yes. Participating in groups: Yes. Taking medication as prescribed: Yes. Toleration medication: Yes. Family/Significant other contact made: No, will contact:  Parents - Royston Cornea & Lola Deiss Patient understands diagnosis: Yes. Discussing patient identified problems/goals with staff: Yes. Medical problems stabilized or resolved:  Yes. Denies suicidal/homicidal ideation: Yes. Issues/concerns per patient self-inventory: No. Other: n/a  New problem(s) identified: No, Describe:  None  New Short Term/Long Term Goal(s): medication stabilization, elimination of SI thoughts, development of comprehensive mental wellness plan.   Patient Goals:  try to get my anxiety under control  Discharge Plan or Barriers: Patient recently admitted. CSW will continue to follow and assess for appropriate referrals and possible discharge planning.    Reason for Continuation of Hospitalization: Anxiety Depression Medication stabilization Other; describe Mood stabilization, discharge planning  Estimated Length of Stay: 3-5 DAYS  Last 3 [place] Suicide Severity Risk Score: Flowsheet Row Admission (Current) [facility] from 12/01/2023 in [facility] at [facility] ED from 04/09/2023 in [facility] Department at [facility]  C-SSRS RISK CATEGORY No Risk No Risk No Risk    Last PHQ 2/9 Scores:     No data to display          Scribe for Treatment Team: Amonte Brookover N Meliton Samad, LCSW 12/03/2023 1:20 PM
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
## 2023-12-03 NOTE — [facility] Group Notes (Signed)
 Spirituality Group   Group Goal: Support / Education around grief and loss    Group Description: Following introductions and group rules, group members engaged in facilitated group dialog and support around topic of loss, with particular support around experiences of loss in their lives. Group members identified types of loss (relationships / self / things) as well as patterns, circumstances, and changes that precipitate loss. Reflection invited on thoughts / feelings around loss, normalized grief responses, and recognized variety in grief experience. Group noted Worden's four tasks of grief in discussion. Group drew on Adlerian / Rogerian, narrative, MI, with Yalom's group therapy as a primary framework.   Observations: Carmine shared with openness and was very engaged in the group discussion. He believes he has largely processed his brother's death, but this seems unlikely. He shifted focus to reactions and needs of parents.  Athalee Esterline L. Minetta Aly, M.Div [phone]

## 2023-12-04 DIAGNOSIS — F332 Major depressive disorder, recurrent severe without psychotic features: Secondary | ICD-10-CM | POA: Diagnosis not present

## 2023-12-04 MED ORDER — FLUOXETINE HCL 20 MG PO CAPS
20.0000 mg | ORAL_CAPSULE | Freq: Every day | ORAL | Status: DC
  Administered 2023-12-05 – 2023-12-07 (×3): 20 mg via ORAL
  Filled 2023-12-04 (×2): qty 1
  Filled 2023-12-04: qty 7
  Filled 2023-12-04: qty 1

## 2023-12-04 NOTE — Progress Notes (Signed)
   12/04/23 0800  Psych Admission Type (Psych Patients Only)  Admission Status Involuntary  Psychosocial Assessment  Patient Complaints None  Eye Contact Fair  Facial Expression Animated  Affect Appropriate to circumstance  Speech Logical/coherent  Interaction Assertive  Motor Activity Slow  Appearance/Hygiene Unremarkable  Behavior Characteristics Cooperative;Appropriate to situation  Mood Euthymic;Pleasant  Aggressive Behavior  Effect No apparent injury  Thought Process  Coherency WDL  Content WDL  Delusions WDL  Perception WDL  Hallucination None reported or observed  Judgment WDL  Confusion WDL  Danger to Self  Current suicidal ideation? Denies  Agreement Not to Harm Self Yes  Description of Agreement Verbal  Danger to Others  Danger to Others None reported or observed

## 2023-12-04 NOTE — Group Note (Signed)
 Date:  12/04/2023 Time:  10:48 AM  Group Topic/Focus:  Coping With Mental Health Crisis:   The purpose of this group is to help patients identify strategies for coping with mental health crisis.  Group discusses possible causes of crisis and ways to manage them effectively. Goals Group:   The focus of this group is to help patients establish daily goals to achieve during treatment and discuss how the patient can incorporate goal setting into their daily lives to aide in recovery. Orientation:   The focus of this group is to educate the patient on the purpose and policies of crisis stabilization and provide a format to answer questions about their admission.  The group details unit policies and expectations of patients while admitted.    Participation Level:  Active  Participation Quality:  Appropriate  Affect:  Appropriate  Cognitive:  Appropriate  Insight: Appropriate  Engagement in Group:  Engaged  Modes of Intervention:  Discussion  Additional Comments:  Pt goal is to work on his discharge plan.   Almarie Arias 12/04/2023, 10:48 AM

## 2023-12-04 NOTE — Group Note (Signed)
 Recreation Therapy Group Note   Group Topic:Animal Assisted Therapy   Group Date: 12/04/2023 Start Time: 8657 End Time: 1035 Facilitators: Orion Mole-McCall, LRT,CTRS Location: [address] Dayroom   Animal-Assisted Activity (AAA) Program Checklist/Progress Notes Patient Eligibility Criteria Checklist & Daily Group note for Rec Tx Intervention   AAA/T Program Assumption of Risk Form signed by Patient/ or Parent Legal Guardian Yes  Patient is free of allergies or severe asthma Yes  Patient reports no fear of animals Yes  Patient reports no history of cruelty to animals Yes  Patient understands his/her participation is voluntary Yes  Patient washes hands before animal contact Yes  Patient washes hands after animal contact Yes  Behavioral Response: Engaged   Education: Charity fundraiser, Appropriate Animal Interaction   Education Outcome: Acknowledges education.    Affect/Mood: Appropriate   Participation Level: Engaged   Participation Quality: Independent   Behavior: Appropriate   Speech/Thought Process: Focused   Insight: Good   Judgement: Good   Modes of Intervention: Teaching laboratory technician   Patient Response to Interventions:  Engaged   Education Outcome:  In group clarification offered    Clinical Observations/Individualized Feedback: Patient attended session and interacted appropriately with therapy dog and peers. Patient asked appropriate questions about therapy dog and his training. Patient shared stories about their pets at home with group.    Plan: Continue to engage patient in RT group sessions 2-3x/week.   Todd Espinoza, LRT,CTRS 12/04/2023 1:06 PM

## 2023-12-04 NOTE — Group Note (Signed)
 LCSW Group Therapy Note   Group Date: 12/04/2023 Start Time: 1100 End Time: 1200  Participation:  patient was present and actively participated in the discussion.  Type of Therapy:  Group Therapy  Topic:  Understanding Your Path to Change  Objective:  The goal is to help individuals understand the stages of change, identify where they currently are in the process, and provide actionable next steps to continue moving forward in their journey of change.  Goals: Learn about the six stages of change:  Precontemplation, Contemplation, Preparation, Action, Maintenance, and Relapse Reflect on Current Change Efforts:  Recognize which stage participants are in regarding a personal change. Plan Next Steps for Moving Forward:  Create an action plan based on their current stage of change.  Class Summary:  In this session, we explored the Stages of Change as a framework to understand the process of change.  We discussed how each stage helps individuals recognize where they are in their personal journey and used the Stages of Change Worksheet for self-reflection. Participants answered questions to better understand their current stage, challenges, and progress. We also emphasized the importance of moving forward, even if setbacks (Relapse) occur, and created actionable steps to help participants continue progressing. By the end of the session, participants gained a clearer understanding of their path to change and left with a clear plan for next steps.  Therapeutic Modalities:  Elements of CBT (cognitive restructuring, problem solving)  Element of DBT (mindfulness, distress tolerance)   Todd Espinoza, LCSWA 12/04/2023  12:23 PM

## 2023-12-04 NOTE — Group Note (Signed)
 Date:  12/04/2023 Time:  8:38 PM  Group Topic/Focus:  Wrap-Up Group:   The focus of this group is to help patients review their daily goal of treatment and discuss progress on daily workbooks.    Participation Level:  Active  Participation Quality:  Appropriate  Affect:  Appropriate  Cognitive:  Appropriate  Insight: Appropriate  Engagement in Group:  Engaged  Modes of Intervention:  Education and Exploration  Additional Comments:  Patient attended and participated in group tonight. 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 The day was better than expected.  Eugena Herter Dacosta 12/04/2023, 8:38 PM
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
## 2023-12-04 NOTE — Plan of Care (Signed)

## 2023-12-04 NOTE — Plan of Care (Signed)
  Problem: Activity: Goal: Interest or engagement in activities will improve Outcome: Progressing   Problem: Safety: Goal: Periods of time without injury will increase Outcome: Progressing   Problem: Physical Regulation: Goal: Ability to maintain clinical measurements within normal limits will improve Outcome: Progressing

## 2023-12-05 DIAGNOSIS — F332 Major depressive disorder, recurrent severe without psychotic features: Secondary | ICD-10-CM | POA: Diagnosis not present

## 2023-12-05 NOTE — [facility] Group Notes (Signed)
 Adult Psychoeducational Group Note  Date:  12/05/2023 Time:  8:35 PM  Group Topic/Focus:  Wrap-Up Group:   The focus of this group is to help patients review their daily goal of treatment and discuss progress on daily workbooks.  Participation Level:  Active  Participation Quality:  Appropriate  Affect:  Appropriate  Cognitive:  Appropriate  Insight: Appropriate  Engagement in Group:  Engaged  Modes of Intervention:  Discussion and Support  Additional Comments:  Pt told that today was a generally good day on the unit, the highlight of which was looking forward to his upcoming discharge. On the subject of ways to stay well upon leaving, Pt mentioned wanting to stay focused on work. Pt rated his day a 7 out of 10.  Kateline Kinkade Lee 12/05/2023, 8:35 PM

## 2023-12-05 NOTE — Group Note (Signed)
 Date:  12/05/2023 Time:  9:17 AM  Group Topic/Focus:  Goals Group:   The focus of this group is to help patients establish daily goals to achieve during treatment and discuss how the patient can incorporate goal setting into their daily lives to aide in recovery.    Participation Level:  Active  Participation Quality:  Appropriate and Attentive  Affect:  Appropriate  Cognitive:  Alert and Appropriate  Insight: Appropriate and Good  Engagement in Group:  Engaged  Modes of Intervention:  Discussion  Additional Comments:  Pt state that is goal is speak with a Child psychotherapist and to communicate more with his peers here at [facility].  Tita Form 12/05/2023, 9:17 AM

## 2023-12-05 NOTE — Plan of Care (Signed)
Problem: Education: Goal: Knowledge of [place] [facility] Education information/materials will improve Outcome: Progressing   Problem: Activity: Goal: Interest or engagement in activities will improve Outcome: Progressing Goal: Sleeping patterns will improve Outcome: Progressing   Problem: Coping: Goal: Ability to verbalize frustrations and anger appropriately will improve Outcome: Progressing

## 2023-12-05 NOTE — Plan of Care (Signed)
  Problem: Education: Goal: Mental status will improve Outcome: Progressing   Problem: Activity: Goal: Interest or engagement in activities will improve Outcome: Progressing   Problem: Coping: Goal: Ability to verbalize frustrations and anger appropriately will improve Outcome: Progressing Goal: Ability to demonstrate self-control will improve Outcome: Progressing

## 2023-12-05 NOTE — Progress Notes (Signed)
   12/05/23 2048  Psych Admission Type (Psych Patients Only)  Admission Status Involuntary  Psychosocial Assessment  Patient Complaints None  Eye Contact Fair  Facial Expression Animated  Affect Appropriate to circumstance  Speech Logical/coherent  Interaction Assertive  Motor Activity Slow  Appearance/Hygiene Improved  Behavior Characteristics Cooperative;Appropriate to situation  Mood Pleasant  Thought Process  Coherency WDL  Content WDL  Delusions None reported or observed  Perception WDL  Hallucination None reported or observed  Judgment Poor  Confusion None  Danger to Self  Current suicidal ideation? Denies  Agreement Not to Harm Self Yes  Description of Agreement verbal  Danger to Others  Danger to Others None reported or observed
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
## 2023-12-05 NOTE — Progress Notes (Signed)
   12/04/23 2000  Psych Admission Type (Psych Patients Only)  Admission Status Involuntary  Psychosocial Assessment  Patient Complaints None  Eye Contact Fair  Facial Expression Animated  Affect Appropriate to circumstance  Speech Logical/coherent  Interaction Assertive  Motor Activity Slow  Appearance/Hygiene Improved  Behavior Characteristics Cooperative;Appropriate to situation  Mood Pleasant  Thought Process  Coherency WDL  Content WDL  Delusions None reported or observed  Perception WDL  Hallucination None reported or observed  Judgment Poor  Confusion None  Danger to Self  Current suicidal ideation? Denies  Agreement Not to Harm Self Yes  Description of Agreement verbal  Danger to Others  Danger to Others None reported or observed

## 2023-12-05 NOTE — [facility] Group Notes (Signed)
 Spirituality Group   Description: Participant directed exploration of values, beliefs and meaning   Following a brief framework of chaplain's role and ground rules of group behavior, participants are invited to share concerns or questions that engage spiritual life. Emphasis placed on common themes and shared experiences and ways to make meaning and clarify living into one's values.   Theory/Process/Goal: Utilize the theoretical framework of group therapy established by Derrell Flight, Relational Cultural Theory and Rogerian approaches to facilitate relational empathy and use of the "here and now" to foster reflection, self-awareness, and sharing.   Observations: Todd Espinoza participated actively in group discussion and engaged peers.  Dharma Pare L. Minetta Aly, M.Div [phone]

## 2023-12-05 NOTE — Progress Notes (Signed)
   12/05/23 1000  Psych Admission Type (Psych Patients Only)  Admission Status Involuntary  Psychosocial Assessment  Patient Complaints None  Eye Contact Fair  Facial Expression Animated  Affect Appropriate to circumstance  Speech Logical/coherent  Interaction Assertive  Motor Activity Slow  Appearance/Hygiene Unremarkable  Behavior Characteristics Cooperative;Appropriate to situation  Mood Pleasant  Thought Process  Coherency WDL  Content WDL  Delusions None reported or observed  Perception WDL  Hallucination None reported or observed  Judgment Poor  Confusion None  Danger to Self  Current suicidal ideation? Denies  Agreement Not to Harm Self Yes  Description of Agreement verbal  Danger to Others  Danger to Others None reported or observed

## 2023-12-05 NOTE — Group Note (Signed)
 Recreation Therapy Group Note   Group Topic:Other  Group Date: 12/05/2023 Start Time: 1412 End Time: 1457 Facilitators: Meleana Commerford-McCall, LRT,CTRS Location: [address] Dayroom   Activity Description/Intervention: Therapeutic Drumming. Patients with peers and staff were given the opportunity to engage in a leader facilitated [facility] Group Empowerment Drumming Circle with staff from the [employer], in partnership with [employer]. Teaching laboratory technician and trained [employer], Kathlyne Parchment leading with LRT observing and documenting intervention and pt response. This evidenced-based practice targets 7 areas of health and wellbeing in the human experience including: stress-reduction, exercise, self-expression, camaraderie/support, nurturing, spirituality, and music-making (leisure).   Goal Area(s) Addresses:  Patient will engage in pro-social way in music group.  Patient will follow directions of drum leader on the first prompt. Patient will demonstrate no behavioral issues during group.  Patient will identify if a reduction in stress level occurs as a result of participation in therapeutic drum circle.    Education: Leisure exposure, Pharmacologist, Musical expression, Discharge Planning   Affect/Mood: Appropriate   Participation Level: Engaged   Participation Quality: Independent   Behavior: Appropriate   Speech/Thought Process: Focused   Insight: Good   Judgement: Good   Modes of Intervention: Teaching laboratory technician   Patient Response to Interventions:  Engaged   Education Outcome:  In group clarification offered    Clinical Observations/Individualized Feedback: Todd Espinoza actively engaged in therapeutic drumming exercise and discussions. Pt was appropriate with peers, staff, and musical equipment for duration of programming.  Pt identified thunder as their feeling after participation in music-based programming. Pt affect congruent/incongruent with  verbalized emotion.     Plan: Continue to engage patient in RT group sessions 2-3x/week.   Todd Espinoza, LRT,CTRS 12/05/2023 4:14 PM

## 2023-12-06 DIAGNOSIS — F332 Major depressive disorder, recurrent severe without psychotic features: Secondary | ICD-10-CM

## 2023-12-06 NOTE — [facility] Group Notes (Signed)
 Adult Psychoeducational Group Note  Date:  12/06/2023 Time:  12:01 PM  Group Topic/Focus:  Goals Group:   The focus of this group is to help patients establish daily goals to achieve during treatment and discuss how the patient can incorporate goal setting into their daily lives to aide in recovery.  Participation Level:  Active  Participation Quality:  Appropriate and Attentive  Affect:  Appropriate  Cognitive:  Appropriate  Insight: Appropriate  Engagement in Group:  Engaged  Modes of Intervention:  Exploration  Additional Comments:  Pt participated in Goals group. Pt stated his goal is to call and check on home and business.    Rynlee Lisbon 12/06/2023, 12:01 PM

## 2023-12-06 NOTE — Group Note (Signed)
 LCSW Group Therapy Note   Group Date: 12/06/2023 Start Time: 1100 End Time: 1200  Participation:  patient was present  Type of Therapy:  Group Therapy   Topic:  Healing From Within: Understanding Our Past, Building Our Future"  Objective:  To help participants understand the impact of early experiences on mental and physical health, with a focus on Adverse Childhood Experiences (ACEs), and to explore ways to build resilience and healing.  Group Goals: Understand ACEs and Their Impact: Learn how childhood experiences shape mental and physical health. Build Resilience: Develop strategies for overcoming challenges and creating positive change. Promote Healing: Recognize the value of support and the possibility of healing through therapy and self-care.  Summary: In today's session, we discussed how early experiences, especially ACEs, impact mental and physical health. We explored the effects of stress, abuse, and neglect on brain development and well-being. The group focused on resilience, understanding that healing and positive change are possible with support and self-awareness.  Therapeutic Modalities Used: Psychoeducation: Sharing information about ACEs and their effects. Cognitive Behavioral Therapy (CBT): Helping reframe negative thought patterns. Trauma-Informed Therapy: Creating a safe, supportive space for healing.   Taronda Comacho O Emani Taussig, LCSWA 12/06/2023  7:32 PM
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
## 2023-12-06 NOTE — Plan of Care (Signed)
   Problem: Education: Goal: Emotional status will improve Outcome: Progressing Goal: Verbalization of understanding the information provided will improve Outcome: Progressing   Problem: Activity: Goal: Sleeping patterns will improve Outcome: Progressing   Problem: Coping: Goal: Ability to demonstrate self-control will improve Outcome: Progressing   Problem: Health Behavior/Discharge Planning: Goal: Compliance with treatment plan for underlying cause of condition will improve Outcome: Progressing   Problem: Safety: Goal: Periods of time without injury will increase Outcome: Progressing

## 2023-12-06 NOTE — Progress Notes (Signed)
 Adult Psychoeducational Group Note  Date:  12/06/2023 Time:  8:57 PM  Group Topic/Focus:  Wrap-Up Group:   The focus of this group is to help patients review their daily goal of treatment and discuss progress on daily workbooks.  Participation Level:  Active  Participation Quality:  Appropriate  Affect:  Appropriate  Cognitive:  Appropriate  Insight: Appropriate  Engagement in Group:  Engaged  Modes of Intervention:  Discussion  Additional Comments:  Dusting said the positive thing he gets to go home tomorrow  Caye Cocks 12/06/2023, 8:57 PM

## 2023-12-06 NOTE — Progress Notes (Signed)
   12/06/23 0900  Psych Admission Type (Psych Patients Only)  Admission Status Involuntary  Psychosocial Assessment  Patient Complaints None  Eye Contact Fair  Facial Expression Animated  Affect Appropriate to circumstance  Speech Logical/coherent  Interaction Assertive  Motor Activity Slow  Appearance/Hygiene Unremarkable  Behavior Characteristics Cooperative;Appropriate to situation  Mood Pleasant  Thought Process  Coherency WDL  Content WDL  Delusions None reported or observed  Perception WDL  Hallucination None reported or observed  Judgment Poor  Confusion None  Danger to Self  Current suicidal ideation? Denies  Agreement Not to Harm Self Yes  Description of Agreement verbal  Danger to Others  Danger to Others None reported or observed

## 2023-12-06 NOTE — Plan of Care (Signed)
   Problem: Education: Goal: Knowledge of [facility] Education information/materials will improve Outcome: Progressing Goal: Emotional status will improve Outcome: Progressing Goal: Mental status will improve Outcome: Progressing

## 2023-12-07 DIAGNOSIS — F331 Major depressive disorder, recurrent, moderate: Secondary | ICD-10-CM | POA: Diagnosis not present

## 2023-12-07 DIAGNOSIS — F411 Generalized anxiety disorder: Secondary | ICD-10-CM

## 2023-12-07 MED ORDER — BUSPIRONE HCL 10 MG PO TABS: 20.0000 mg | ORAL_TABLET | Freq: Two times a day (BID) | ORAL | 0 refills | Status: AC

## 2023-12-07 MED ORDER — NICOTINE POLACRILEX 2 MG MT GUM: 2.0000 mg | CHEWING_GUM | OROMUCOSAL | Status: AC | PRN

## 2023-12-07 MED ORDER — FLUOXETINE HCL 20 MG PO CAPS: 20.0000 mg | ORAL_CAPSULE | Freq: Every day | ORAL | 0 refills | Status: AC

## 2023-12-07 MED ORDER — HYDROXYZINE HCL 25 MG PO TABS: 25.0000 mg | ORAL_TABLET | Freq: Three times a day (TID) | ORAL | 0 refills | Status: AC | PRN

## 2023-12-07 MED ORDER — TRAZODONE HCL 50 MG PO TABS: 50.0000 mg | ORAL_TABLET | Freq: Every evening | ORAL | 0 refills | Status: AC | PRN

## 2023-12-07 NOTE — Progress Notes (Signed)
  [facility] Adult Case Management Discharge Plan :  Will you be returning to the same living situation after discharge:  Yes,  pt returning home at discharge  At discharge, do you have transportation home?: Yes,  pt will be picked up at 1200PM Do you have the ability to pay for your medications: No.  Release of information consent forms completed and in the chart;  Patient's signature needed at discharge.  Patient to Follow up at:  Follow-up Information     [facility] Go on 12/17/2023.   Why: Please go to this provider for an assessment, to obtain medication management and therapy services.  Please go on 12/17/23 at 8:00 am, or Monday through Fridays, from 8 am to 3 pm during walk in hours. Contact information: [address] [place] [address] [phone]         [facility] Department of Social Services Follow up.   Why: You may make an appointment with DSS for assistance with applying for Medicaid. You may also apply for medicaid online through ePass at [email or web address] Contact information: Address: [address] [place], [place] [address]  Phone: [phone]                Next level of care provider has access to [facility] Link:no  Safety Planning and Suicide Prevention discussed: Yes,  Eulis Salazar (mother) [phone]    Has patient been referred to the Quitline?: Patient refused referral for treatment  Patient has been referred for addiction treatment: Patient refused referral for treatment.  Vonzell Guerin, LCSWA 12/07/2023, 9:03 AM

## 2023-12-07 NOTE — Group Note (Signed)
 Date:  12/07/2023 Time:  10:25 AM  Group Topic/Focus:  Goals Group:   The focus of this group is to help patients establish daily goals to achieve during treatment and discuss how the patient can incorporate goal setting into their daily lives to aide in recovery.    Participation Level:  Active  Participation Quality:  Appropriate and Attentive  Affect:  Appropriate  Cognitive:  Alert and Appropriate  Insight: Appropriate and Good  Engagement in Group:  Engaged  Modes of Intervention:  Discussion  Additional Comments: Pt state his goal for today after being discharged is to pick his meds up and to go back to work today.  Tita Form 12/07/2023, 10:25 AM

## 2023-12-07 NOTE — Group Note (Signed)
 Date:  12/07/2023 Time:  9:33 AM  Group Topic/Focus:  Goals Group:   The focus of this group is to help patients establish daily goals to achieve during treatment and discuss how the patient can incorporate goal setting into their daily lives to aide in recovery.    Participation Level:  Active  Participation Quality:  Appropriate and Attentive  Affect:  Appropriate  Cognitive:  Alert and Appropriate  Insight: Appropriate and Good  Engagement in Group:  Engaged  Modes of Intervention:  Discussion  Additional Comments:  Pt state that his plan is to be discharged today. After being discharge he wants to pick his meds up and stay on track with taking them. Pt also plans to return to work today.  Tita Form 12/07/2023, 9:33 AM

## 2023-12-07 NOTE — [facility] Suicide Risk Assessment (Signed)
 [facility] Discharge Suicide Risk Assessment   Principal Problem: MDD (major depressive disorder) Discharge Diagnoses: Principal Problem:   MDD (major depressive disorder)   Total Time spent with patient: 30 minutes  Musculoskeletal: Strength & Muscle Tone: within normal limits Gait & Station: normal Patient leans: N/A  Psychiatric Specialty Exam  Presentation  General Appearance:  Appropriate for Environment  Eye Contact: Fair  Speech: Clear and Coherent  Speech Volume: Normal  Handedness: Right   Mood and Affect  Mood: Euthymic  Duration of Depression Symptoms: No data recorded Affect: Appropriate   Thought Process  Thought Processes: Goal Directed  Descriptions of Associations:Intact  Orientation:Full (Time, Place and Person)  Thought Content:Logical  History of Schizophrenia/Schizoaffective disorder:No data recorded Duration of Psychotic Symptoms:No data recorded Hallucinations:Hallucinations: None  Ideas of Reference:None  Suicidal Thoughts:Suicidal Thoughts: No  Homicidal Thoughts:Homicidal Thoughts: No   Sensorium  Memory: Immediate Fair  Judgment: Fair  Insight: Fair   Art therapist  Concentration: Fair  Attention Span: Fair  Recall: [employer] of Knowledge: Fair  Language: Fair   Psychomotor Activity  Psychomotor Activity: Psychomotor Activity: Normal   Assets  Assets: Communication Skills; Desire for Improvement; Housing; Physical Health; Resilience; Social Support   Sleep  Sleep: Sleep: Fair  Estimated Sleeping Duration (Last 24 Hours): 6.50-8.00 hours  Physical Exam: Physical Exam ROS Blood pressure 126/81, pulse 67, temperature 98 F (36.7 C), temperature source Oral, resp. rate 18, height 6' (1.829 m), weight 78.9 kg, SpO2 100%. Body mass index is 23.6 kg/m.  Mental Status Per Nursing Assessment::   On Admission:  NA  Demographic Factors:  Male  Loss Factors: Loss of significant  relationship  Historical Factors: Prior suicide attempts  Risk Reduction Factors:   Sense of responsibility to family, Living with another person, especially a relative, Positive social support, Positive therapeutic relationship, and Positive coping skills or problem solving skills  Continued Clinical Symptoms:  Previous Psychiatric Diagnoses and Treatments  Cognitive Features That Contribute To Risk:  None    Suicide Risk:  Minimal: No identifiable suicidal ideation.  Patients presenting with no risk factors but with morbid ruminations; may be classified as minimal risk based on the severity of the depressive symptoms  Patient denies SI or HI for >48 hours. Denies wanting to be dead and future oriented. SRA complete and acute risk for suicide is low.   [place] Suicide Risk assessment:  1. Do you wish to be dead? NONE REPORTED 2. Have you wished your dead or wished you could go to sleep and not wake up? NONE REPORTED 3.  Have you actually had thoughts of killing yourself?  NONE REPORTED 4.  Have you been thinking about how you might do this?  NONE REPORTED 5.  Have you had these thoughts and some intention of acting on them? NONE REPORTED 6.  Have you started to work out or worked out the details to kill yourself? NONE REPORTED 7.  Do you intend to carry out this plan? NONE REPORTED 8. On a scale of 1-5 with 1 being the least severe and 5 being the most severe answer the following questions place for intensity of ideation. ZERO 9. How many times have you had these thoughts? NONE REPORTED 10. When you have the thoughts how long to the last?  NONE REPORTED 11. Control ability.  Could you or can you stop thinking about killing herself or wanting to die if you want to?  YES 12. Are there any things anyone or anything family religion  pain of death that stop you from wanting to die or acting on thoughts of committing suicide?  FAMILY 13.  What sort of reason to do have to think about  wanting to die or killing yourself? NONE REPORTED 14.Was it to end the pain or stop the way you are feeling in other words you could not go on living with his pain or how you are feeling or was not to get attention revenge or reaction from others?  Or both?  NONE REPORTED  [place] Suicide Risk assessment:  1. Do you wish to be dead? NONE REPORTED 2. Have you wished your dead or wished you could go to sleep and not wake up? NONE REPORTED 3.  Have you actually had thoughts of killing yourself?  NONE REPORTED 4.  Have you been thinking about how you might do this?  NONE REPORTED 5.  Have you had these thoughts and some intention of acting on them? NONE REPORTED 6.  Have you started to work out or worked out the details to kill yourself? NONE REPORTED 7.  Do you intend to carry out this plan? NONE REPORTED 8. On a scale of 1-5 with 1 being the least severe and 5 being the most severe answer the following questions place for intensity of ideation. ZERO 9. How many times have you had these thoughts? NONE REPORTED 10. When you have the thoughts how long to the last?  NONE REPORTED 11. Control ability.  Could you or can you stop thinking about killing herself or wanting to die if you want to?  YES 12. Are there any things anyone or anything family religion pain of death that stop you from wanting to die or acting on thoughts of committing suicide?  FAMILY 13.  What sort of reason to do have to think about wanting to die or killing yourself? NONE REPORTED 14.Was it to end the pain or stop the way you are feeling in other words you could not go on living with his pain or how you are feeling or was not to get attention revenge or reaction from others?  Or both?  NONE REPORTED    Follow-up Information     [facility] Go on 12/17/2023.   Why: Please go to this provider for an assessment, to obtain medication management and therapy services.  Please go on 12/17/23 at 8:00 am, or Monday through  Fridays, from 8 am to 3 pm during walk in hours. Contact information: [address] [place] [address] [phone]         [facility] Department of Social Services Follow up.   Why: You may make an appointment with DSS for assistance with applying for Medicaid. You may also apply for medicaid online through ePass at [email or web address] Contact information: Address: [address] [place], [place] [address]  Phone: [phone]                Plan Of Care/Follow-up recommendations:  Followup as above  Forrestine Lecrone, MD 12/07/2023, 9:17 AM

## 2023-12-07 NOTE — Progress Notes (Signed)
   12/07/23 0800  Psych Admission Type (Psych Patients Only)  Admission Status Involuntary  Psychosocial Assessment  Patient Complaints None  Eye Contact Fair  Facial Expression Animated  Affect Appropriate to circumstance  Speech Logical/coherent  Interaction Assertive  Motor Activity Other (Comment) (WNL)  Appearance/Hygiene Unremarkable  Behavior Characteristics Cooperative;Appropriate to situation  Mood Pleasant  Thought Process  Coherency WDL  Content WDL  Delusions None reported or observed  Perception WDL  Hallucination None reported or observed  Judgment Poor  Confusion None  Danger to Self  Current suicidal ideation? Denies  Agreement Not to Harm Self Yes  Description of Agreement verbal  Danger to Others  Danger to Others None reported or observed

## 2023-12-07 NOTE — Progress Notes (Signed)
 Discharge Note:  Patient denies SI/HI/AVH at this time. Discharge instructions, AVS, prescriptions, and transition record gone over with patient. Patient agrees to comply with medication management, follow-up visit, and outpatient therapy. Patient questions and concerns addressed and answered. Patient ambulatory off unit. Patient discharged to home with parents.
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
## 2024-05-14 ENCOUNTER — Emergency Department
Admission: EM | Admit: 2024-05-14 | Discharge: 2024-05-14 | Disposition: A | Discharge status: Home / Self Care | Payer: Self-pay | Attending: Emergency Medicine | Admitting: Emergency Medicine

## 2024-05-14 ENCOUNTER — Emergency Department: Payer: Self-pay

## 2024-05-14 ENCOUNTER — Encounter: Payer: Self-pay | Admitting: Emergency Medicine

## 2024-05-14 DIAGNOSIS — S01511A Laceration without foreign body of lip, initial encounter: Secondary | ICD-10-CM | POA: Insufficient documentation

## 2024-05-14 DIAGNOSIS — M25512 Pain in left shoulder: Secondary | ICD-10-CM | POA: Insufficient documentation

## 2024-05-14 DIAGNOSIS — S0990XA Unspecified injury of head, initial encounter: Secondary | ICD-10-CM | POA: Insufficient documentation

## 2024-05-14 DIAGNOSIS — M7918 Myalgia, other site: Secondary | ICD-10-CM

## 2024-05-14 MED ORDER — IBUPROFEN 600 MG PO TABS
600.0000 mg | ORAL_TABLET | Freq: Once | ORAL | Status: AC
Start: 2024-05-14 — End: 2024-05-14
  Administered 2024-05-14: 600 mg via ORAL
  Filled 2024-05-14: qty 1

## 2024-05-14 MED ORDER — ONDANSETRON 4 MG PO TBDP
4.0000 mg | ORAL_TABLET | Freq: Once | ORAL | Status: AC
  Administered 2024-05-14: 4 mg via ORAL
  Filled 2024-05-14: qty 1

## 2024-05-14 NOTE — Discharge Instructions (Addendum)
 Please return to the emergency department if symptoms change or worsen.  Take ibuprofen  or Tylenol  if needed for headache and muscle pain.
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
